# Patient Record
Sex: Female | Born: 1985 | Race: Black or African American | Hispanic: No | Marital: Married | Smoking: Never smoker
Health system: Southern US, Community
[De-identification: ages and names within clinical notes are randomized; demographics above are authoritative.]

## PROBLEM LIST (undated history)

## (undated) DIAGNOSIS — D649 Anemia, unspecified: Secondary | ICD-10-CM

## (undated) DIAGNOSIS — Z973 Presence of spectacles and contact lenses: Secondary | ICD-10-CM

## (undated) DIAGNOSIS — D241 Benign neoplasm of right breast: Secondary | ICD-10-CM

## (undated) DIAGNOSIS — D259 Leiomyoma of uterus, unspecified: Secondary | ICD-10-CM

## (undated) DIAGNOSIS — R7303 Prediabetes: Secondary | ICD-10-CM

## (undated) DIAGNOSIS — D573 Sickle-cell trait: Secondary | ICD-10-CM

## (undated) DIAGNOSIS — K219 Gastro-esophageal reflux disease without esophagitis: Secondary | ICD-10-CM

## (undated) DIAGNOSIS — D219 Benign neoplasm of connective and other soft tissue, unspecified: Secondary | ICD-10-CM

## (undated) DIAGNOSIS — D72819 Decreased white blood cell count, unspecified: Secondary | ICD-10-CM

## (undated) DIAGNOSIS — D242 Benign neoplasm of left breast: Secondary | ICD-10-CM

## (undated) HISTORY — DX: Benign neoplasm of right breast: D24.2

## (undated) HISTORY — DX: Benign neoplasm of right breast: D24.1

## (undated) HISTORY — DX: Sickle-cell trait: D57.3

## (undated) HISTORY — DX: Anemia, unspecified: D64.9

## (undated) HISTORY — DX: Benign neoplasm of connective and other soft tissue, unspecified: D21.9

## (undated) HISTORY — DX: Gastro-esophageal reflux disease without esophagitis: K21.9

## (undated) HISTORY — PX: BREAST SURGERY: SHX581

## (undated) HISTORY — DX: Decreased white blood cell count, unspecified: D72.819

---
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
## 2006-06-14 DIAGNOSIS — D241 Benign neoplasm of right breast: Secondary | ICD-10-CM

## 2006-06-14 HISTORY — DX: Benign neoplasm of right breast: D24.1

## 2006-06-14 HISTORY — PX: BREAST LUMPECTOMY: SHX2

## 2009-06-10 ENCOUNTER — Ambulatory Visit: Payer: Self-pay | Admitting: Physician Assistant

## 2009-06-10 DIAGNOSIS — N63 Unspecified lump in unspecified breast: Secondary | ICD-10-CM | POA: Insufficient documentation

## 2009-06-10 DIAGNOSIS — N644 Mastodynia: Secondary | ICD-10-CM | POA: Insufficient documentation

## 2009-06-10 LAB — CONVERTED CEMR LAB
Nitrite: NEGATIVE
Rapid HIV Screen: NEGATIVE
Specific Gravity, Urine: 1.025

## 2009-06-11 LAB — CONVERTED CEMR LAB
AST: 14 units/L (ref 0–37)
Albumin: 4.2 g/dL (ref 3.5–5.2)
Amphetamine Screen, Ur: NEGATIVE
BUN: 8 mg/dL (ref 6–23)
Basophils Absolute: 0 10*3/uL (ref 0.0–0.1)
Chloride: 103 meq/L (ref 96–112)
Creatinine, Ser: 0.76 mg/dL (ref 0.40–1.20)
Glucose, Bld: 86 mg/dL (ref 70–99)
HCT: 34.9 % — ABNORMAL LOW (ref 36.0–46.0)
Hemoglobin: 11.6 g/dL — ABNORMAL LOW (ref 12.0–15.0)
Lymphs Abs: 1.3 10*3/uL (ref 0.7–4.0)
MCHC: 33.2 g/dL (ref 30.0–36.0)
Phencyclidine (PCP): NEGATIVE
Potassium: 4.8 meq/L (ref 3.5–5.3)
Propoxyphene: NEGATIVE
RBC: 4.43 M/uL (ref 3.87–5.11)
RDW: 12.8 % (ref 11.5–15.5)
Sodium: 137 meq/L (ref 135–145)
TSH: 0.949 microintl units/mL (ref 0.350–4.500)
Total Bilirubin: 0.8 mg/dL (ref 0.3–1.2)
WBC: 3.4 10*3/uL — ABNORMAL LOW (ref 4.0–10.5)

## 2009-06-12 ENCOUNTER — Encounter: Admission: RE | Admit: 2009-06-12 | Discharge: 2009-06-12 | Payer: Self-pay | Admitting: Internal Medicine

## 2009-06-20 ENCOUNTER — Telehealth: Payer: Self-pay | Admitting: Physician Assistant

## 2009-06-25 ENCOUNTER — Ambulatory Visit: Payer: Self-pay | Admitting: Physician Assistant

## 2009-07-06 DIAGNOSIS — D573 Sickle-cell trait: Secondary | ICD-10-CM | POA: Insufficient documentation

## 2009-07-06 DIAGNOSIS — D708 Other neutropenia: Secondary | ICD-10-CM | POA: Insufficient documentation

## 2009-07-06 LAB — CONVERTED CEMR LAB
HCT: 34.5 % — ABNORMAL LOW (ref 36.0–46.0)
Hemoglobin: 11.2 g/dL — ABNORMAL LOW (ref 12.0–15.0)
MCV: 80.4 fL (ref 78.0–100.0)
RBC: 4.29 M/uL (ref 3.87–5.11)
WBC: 2.8 10*3/uL — ABNORMAL LOW (ref 4.0–10.5)

## 2009-07-11 ENCOUNTER — Encounter (INDEPENDENT_AMBULATORY_CARE_PROVIDER_SITE_OTHER): Payer: Self-pay | Admitting: *Deleted

## 2009-07-18 ENCOUNTER — Telehealth: Payer: Self-pay | Admitting: Physician Assistant

## 2009-07-29 ENCOUNTER — Ambulatory Visit: Payer: Self-pay | Admitting: Physician Assistant

## 2009-07-29 DIAGNOSIS — R82998 Other abnormal findings in urine: Secondary | ICD-10-CM | POA: Insufficient documentation

## 2009-07-29 LAB — CONVERTED CEMR LAB
Blood in Urine, dipstick: NEGATIVE
Nitrite: NEGATIVE
Pap Smear: NEGATIVE
Protein, U semiquant: NEGATIVE
Specific Gravity, Urine: 1.015
Whiff Test: NEGATIVE

## 2009-07-30 ENCOUNTER — Encounter: Payer: Self-pay | Admitting: Physician Assistant

## 2009-07-30 LAB — CONVERTED CEMR LAB: GC Probe Amp, Genital: NEGATIVE

## 2009-08-01 ENCOUNTER — Encounter: Payer: Self-pay | Admitting: Physician Assistant

## 2009-08-06 ENCOUNTER — Ambulatory Visit: Payer: Self-pay | Admitting: Oncology

## 2009-08-11 LAB — CBC & DIFF AND RETIC
BASO%: 0.3 % (ref 0.0–2.0)
Basophils Absolute: 0 10*3/uL (ref 0.0–0.1)
EOS%: 1 % (ref 0.0–7.0)
HCT: 36.2 % (ref 34.8–46.6)
HGB: 12.3 g/dL (ref 11.6–15.9)
LYMPH%: 50.1 % — ABNORMAL HIGH (ref 14.0–49.7)
MCHC: 34 g/dL (ref 31.5–36.0)
MONO#: 0.5 10*3/uL (ref 0.1–0.9)
NEUT%: 36.1 % — ABNORMAL LOW (ref 38.4–76.8)
RBC: 4.64 10*6/uL (ref 3.70–5.45)
Retic %: 0.71 % (ref 0.50–1.50)
WBC: 3.9 10*3/uL (ref 3.9–10.3)
nRBC: 0 % (ref 0–0)

## 2009-08-11 LAB — MORPHOLOGY: PLT EST: DECREASED

## 2009-08-11 LAB — LACTATE DEHYDROGENASE: LDH: 130 U/L (ref 94–250)

## 2009-08-13 ENCOUNTER — Encounter: Payer: Self-pay | Admitting: Physician Assistant

## 2009-08-13 LAB — ANTI-NUCLEAR AB-TITER (ANA TITER): ANA Titer 1: 1:160 {titer} — ABNORMAL HIGH

## 2009-08-13 LAB — CONVERTED CEMR LAB
Basophils Absolute: 0 10*3/uL
Basophils Relative: 0.3 %
Eosinophils Absolute: 1 10*3/uL
Hemoglobin: 12.3 g/dL
MCHC: 34 g/dL
MCV: 78 fL
Monocytes Absolute: 0.5 10*3/uL
Monocytes Relative: 12.5 %
Neutro Abs: 1.4 10*3/uL
RBC: 4.64 M/uL
RDW: 12.5 %
Retic Ct Pct: 0.71 %
WBC: 3.9 10*3/uL

## 2009-08-13 LAB — ANA: Anti Nuclear Antibody(ANA): POSITIVE — AB

## 2009-08-20 ENCOUNTER — Encounter: Payer: Self-pay | Admitting: Physician Assistant

## 2009-08-21 ENCOUNTER — Ambulatory Visit: Payer: Self-pay | Admitting: Physician Assistant

## 2009-08-21 LAB — CONVERTED CEMR LAB
Cholesterol: 125 mg/dL (ref 0–200)
HDL: 43 mg/dL (ref >39)
Total CHOL/HDL Ratio: 2.9

## 2009-08-22 ENCOUNTER — Telehealth: Payer: Self-pay | Admitting: Physician Assistant

## 2009-08-22 ENCOUNTER — Encounter: Payer: Self-pay | Admitting: Physician Assistant

## 2009-08-25 LAB — EPSTEIN-BARR VIRUS VCA, IGG: EBV VCA IgG: 3.87 {ISR} — ABNORMAL HIGH

## 2009-08-25 LAB — CMV IGM: CMV IgM: <8 AU/mL (ref <30.0)

## 2009-08-25 LAB — HEPATITIS B SURFACE ANTIGEN: Hepatitis B Surface Ag: NEGATIVE

## 2009-08-25 LAB — EPSTEIN-BARR VIRUS NUCLEAR ANTIGEN ANTIBODY, IGG: EBV NA IgG: 1.38 {ISR} — ABNORMAL HIGH

## 2009-08-27 ENCOUNTER — Encounter: Payer: Self-pay | Admitting: Physician Assistant

## 2009-08-27 ENCOUNTER — Telehealth (INDEPENDENT_AMBULATORY_CARE_PROVIDER_SITE_OTHER): Payer: Self-pay | Admitting: *Deleted

## 2009-09-03 ENCOUNTER — Ambulatory Visit: Payer: Self-pay | Admitting: Oncology

## 2009-09-05 ENCOUNTER — Encounter: Payer: Self-pay | Admitting: Physician Assistant

## 2009-10-05 ENCOUNTER — Encounter: Payer: Self-pay | Admitting: Physician Assistant

## 2009-10-05 DIAGNOSIS — R799 Abnormal finding of blood chemistry, unspecified: Secondary | ICD-10-CM | POA: Insufficient documentation

## 2009-11-13 ENCOUNTER — Encounter: Admission: RE | Admit: 2009-11-13 | Discharge: 2009-11-13 | Payer: Self-pay | Admitting: Internal Medicine

## 2009-11-16 DIAGNOSIS — D249 Benign neoplasm of unspecified breast: Secondary | ICD-10-CM | POA: Insufficient documentation

## 2009-11-26 ENCOUNTER — Ambulatory Visit: Payer: Self-pay | Admitting: Oncology

## 2009-11-28 ENCOUNTER — Ambulatory Visit: Payer: Self-pay | Admitting: Physician Assistant

## 2009-11-28 LAB — CBC WITH DIFFERENTIAL/PLATELET
Basophils Absolute: 0 10*3/uL (ref 0.0–0.1)
HCT: 33.6 % — ABNORMAL LOW (ref 34.8–46.6)
MCH: 26.3 pg (ref 25.1–34.0)
MCHC: 33.6 g/dL (ref 31.5–36.0)
MCV: 78.1 fL — ABNORMAL LOW (ref 79.5–101.0)
NEUT%: 43.8 % (ref 38.4–76.8)
Platelets: 140 10*3/uL — ABNORMAL LOW (ref 145–400)
RBC: 4.3 10*6/uL (ref 3.70–5.45)
RDW: 12.4 % (ref 11.2–14.5)
WBC: 3.2 10*3/uL — ABNORMAL LOW (ref 3.9–10.3)
lymph#: 1.4 10*3/uL (ref 0.9–3.3)

## 2009-11-28 LAB — MORPHOLOGY: RBC Comments: NORMAL

## 2009-12-01 LAB — CONVERTED CEMR LAB
Basophils Absolute: 0 10*3/uL (ref 0.0–0.1)
Lymphs Abs: 1.3 10*3/uL (ref 0.7–4.0)
MCHC: 32.9 g/dL (ref 30.0–36.0)
MCV: 78.5 fL (ref 78.0–100.0)
Monocytes Relative: 15 % — ABNORMAL HIGH (ref 3–12)
Neutro Abs: 1 10*3/uL — ABNORMAL LOW (ref 1.7–7.7)
Neutrophils Relative %: 37 % — ABNORMAL LOW (ref 43–77)
Platelets: 142 10*3/uL — ABNORMAL LOW (ref 150–400)
RDW: 12.5 % (ref 11.5–15.5)
WBC: 2.7 10*3/uL — ABNORMAL LOW (ref 4.0–10.5)

## 2009-12-02 LAB — ANTI-NUCLEAR AB-TITER (ANA TITER): ANA Titer 1: 1:2560 {titer} — ABNORMAL HIGH

## 2009-12-02 LAB — ANTI-DNA ANTIBODY, DOUBLE-STRANDED: ds DNA Ab: 1 IU/mL (ref <30)

## 2009-12-02 LAB — ANA: Anti Nuclear Antibody(ANA): POSITIVE — AB

## 2009-12-29 ENCOUNTER — Encounter: Payer: Self-pay | Admitting: Physician Assistant

## 2010-05-29 ENCOUNTER — Encounter
Admission: RE | Admit: 2010-05-29 | Discharge: 2010-05-29 | Payer: Self-pay | Source: Home / Self Care | Attending: Internal Medicine | Admitting: Internal Medicine

## 2010-06-12 ENCOUNTER — Ambulatory Visit: Payer: Self-pay | Admitting: Physician Assistant

## 2010-07-14 NOTE — Letter (Signed)
Summary: HEMATOLOGY NOTES  HEMATOLOGY NOTES   Imported By: Arta Bruce 10/07/2009 12:54:42  _____________________________________________________________________  External Attachment:    Type:   Image     Comment:   External Document

## 2010-07-14 NOTE — Letter (Signed)
Summary: *HSN Results Follow up  [facility]  [address] [place], [place] [address]   Phone: [phone]  Fax: [phone]      07/11/2009   NEVA RAMASWAMY [address] [place], [place]  [address]   Dear  Ms. KASOKOMA Kalina,                            ____S.Drinkard,FNP   ____D. Gore,FNP       ____B. McPherson,MD   ____V. Rankins,MD    ____E. Mulberry,MD    ____N. Daphine Deutscher, FNP  ____D. Reche Dixon, MD    ____K. Philipp Deputy, MD    ____Other     This letter is to inform you that your recent test(s):  _______Pap Smear    _______Lab Test     _______X-ray    _______ is within acceptable limits  _______ requires a medication change  _______ requires a follow-up lab visit  _______ requires a follow-up visit with your provider   Comments:  We have been trying to reach you.  Please give the office a call at your earliest convenience.       _________________________________________________________ If you have any questions, please contact our office                     Sincerely,  Armenia Shannon [facility]

## 2010-07-14 NOTE — Progress Notes (Signed)
  Phone Note Outgoing Call   Summary of Call: Ultrasound with probable benign findings. Suggested to have f/u ultrasound in 6 mos. Order in system Initial call taken by: Brynda Rim,  June 20, 2009 4:30 PM  Follow-up for Phone Call        Left message on answering machine for pt to call back.Marland KitchenMarland KitchenArmenia Shannon  June 24, 2009 12:22 PM   Additional Follow-up for Phone Call Additional follow up Details #1::        PT IS AWARE........ Armenia Shannon  June 25, 2009 11:47 AM

## 2010-07-14 NOTE — Progress Notes (Signed)
  Phone Note Outgoing Call   Summary of Call: Please schedule patient to have a CBC with diff in 3 mos. Fax a copy to Dr. Cyndie Chime at [facility]. Initial call taken by: Brynda Rim,  August 27, 2009 4:05 PM  Follow-up for Phone Call        graciela, schedule appt for pt to come in for labs please, she don't have to fast Follow-up by: [place] Shannon,  August 29, 2009 12:17 PM  Additional Follow-up for Phone Call Additional follow up Details #1::        Appointment scheduled Pt will come on September 03, 2009 at 2:30 pm..Marland KitchenManon Hilding  August 29, 2009 1:44 PM

## 2010-07-14 NOTE — Progress Notes (Signed)
  Phone Note Outgoing Call   Summary of Call: I left message for her brother to call me back. She needs referral to heme. Please make sure she understands. . . let me talk to her brother when he calls back. Initial call taken by: Tereso Newcomer PA-C,  July 18, 2009 2:57 PM  Follow-up for Phone Call        Left message on answering machine for pt to call back...Marland KitchenMarland KitchenArmenia Shannon  July 18, 2009 3:44 PM  BROTHER IS AWARE AND WOULD LIKE TO SPEAK WITH SCOTT Follow-up by: [place] Shannon,  July 21, 2009 1:30 PM  Additional Follow-up for Phone Call Additional follow up Details #1::        Spoke with brother today and explained the reason for the referral to hematology. Additional Follow-up by: Tereso Newcomer PA-C,  July 21, 2009 2:15 PM

## 2010-07-14 NOTE — Letter (Signed)
Summary: HEMATOLOGY NOTES  HEMATOLOGY NOTES   Imported By: Arta Bruce 11/03/2009 16:50:23  _____________________________________________________________________  External Attachment:    Type:   Image     Comment:   External Document

## 2010-07-14 NOTE — Assessment & Plan Note (Signed)
Summary: cpp exam///gk   Vital Signs:  Patient profile:   25 year old female Menstrual status:  regular LMP:     07/21/2009 Height:      68 inches Weight:      151.4 pounds BMI:     23.10 Temp:     98 degrees F oral Pulse rate:   67 / minute Pulse rhythm:   regular Resp:     18 per minute BP sitting:   109 / 67  (left arm) Cuff size:   regular  Vitals Entered ByGeanie Cooley (July 29, 2009 3:16 PM) CC: Pt here for CPP.  Is Patient Diabetic? No Pain Assessment Patient in pain? no       Does patient need assistance? Functional Status Self care Ambulation Normal LMP (date): 07/21/2009     Enter LMP: 07/21/2009   CC:  Pt here for CPP. Marland Kitchen  History of Present Illness: Here for CPP. Periods regular.  Has one every month.  Notes bleeding for 3 days, skips a day and then has bleeding for 3 more days.  Bleeding gets lighter. No vaginal discharge, odor or burning. Had breast u/s last month and is due to have a repeat in 6 mos. No FHx of breast or ovarian cancer.   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  u/s 06/2009 with probably benign fibroadenomas   b.  f/u sugg. in 6 mos.  Family History: Last updated: 06/10/2009 unremarkable  Social History: Last updated: 06/10/2009 Occupation: unemployed Never Smoked Alcohol use-no Drug use-no Single  Review of Systems      See HPI General:  Denies chills and fever. CV:  Denies chest pain or discomfort and fainting. Resp:  Denies cough. GI:  Denies bloody stools, dark tarry stools, and indigestion. GU:  Denies dysuria and hematuria. MS:  Denies joint pain. Psych:  Denies  depression.  Physical Exam  General:  alert, well-developed, and well-nourished.   Head:  normocephalic and atraumatic.   Eyes:  pupils equal, pupils round, pupils reactive to light, and no retinal abnormalitiies.   Ears:  R ear normal and L ear normal.   Nose:  no external deformity.   Mouth:  pharynx pink and moist, no erythema, and no exudates.   Neck:  supple.   Breasts:  previously examined  Lungs:  normal breath sounds, no crackles, and no wheezes.   Heart:  normal rate and regular rhythm.   Abdomen:  soft, non-tender, and no hepatomegaly.   Rectal:  no external abnormalities.   Genitalia:  normal introitus, no external lesions, no vaginal discharge, mucosa pink and moist, no vaginal or cervical lesions, no vaginal atrophy, no friaility or hemorrhage, normal uterus size and position, and no adnexal masses or tenderness.   Msk:  normal ROM.   Extremities:  no edema Neurologic:  alert & oriented X3 and cranial nerves II-XII intact.   Skin:  turgor normal.   Psych:  normally interactive.     Impression & Recommendations:  Problem # 1:  PREVENTIVE HEALTH CARE (ICD-V70.0) pap today schedule lipids Td up to date  Orders: KOH/ WET Mount [phone]) T- GC Chlamydia ([address]) T-Pap Smear, Thin Prep (57846)  Problem #  2:  BREAST MASS, RIGHT (ICD-611.72) f/u u/s scheduled in June already  Problem # 3:  OTHER NEUTROPENIA (ICD-288.09) Heme appt pending  Other Orders: T-Culture, Urine ([id number])  Patient Instructions: 1)  Take Calcium 600mg  + Vitamin D 400 international units twice daily. 2)  Schedule lab visit at your convenience in the next 2-4 weeks for Lipids (come fasting; nothing to eat or drink, except water, after midnight the night before).  Dx V70.0 3)  Please schedule a follow-up appointment in 1 year with Marin Milley for CPP or sooner if needed.   Laboratory Results   Urine Tests  Date/Time Received: July 29, 2009 3:49 PM   Routine Urinalysis   Color: lt.  yellow Glucose: negative   (Normal Range: Negative) Bilirubin: negative   (Normal Range: Negative) Ketone: negative   (Normal Range: Negative) Spec. Gravity: 1.015   (Normal Range: 1.003-1.035) Blood: negative   (Normal Range: Negative) pH: 6.0   (Normal Range: 5.0-8.0) Protein: negative   (Normal Range: Negative) Urobilinogen: 0.2   (Normal Range: 0-1) Nitrite: negative   (Normal Range: Negative) Leukocyte Esterace: trace   (Normal Range: Negative)      Wet Mount Source: vaginal WBC/hpf: 1-5 Bacteria/hpf: rare Clue cells/hpf: none  Negative whiff Yeast/hpf: none Wet Mount KOH: Negative Trichomonas/hpf: none

## 2010-07-14 NOTE — Letter (Signed)
[facility]   Imported By: Arta Bruce 08/28/2009 10:10:35  _____________________________________________________________________  External Attachment:    Type:   Image     Comment:   External Document

## 2010-07-14 NOTE — Progress Notes (Signed)
Summary: Office Visit/DEPRESSION SCREENING  Office Visit/DEPRESSION SCREENING   Imported By: Arta Bruce 09/26/2009 14:28:49  _____________________________________________________________________  External Attachment:    Type:   Image     Comment:   External Document

## 2010-07-14 NOTE — Miscellaneous (Signed)
Summary: Hematology Evaluation   Patient seen by Dr. Cyndie Chime. I spoke to him personally. He suspected she has a smoldering autoimmune process likely contributing to her neutropenia. Her ANA was positive.  ESR was 27. ANA Titer:  1:160 Pattern:  Speckled  Dr. Cyndie Chime suggested checking a CBC q 3 mos.  He felt like her neutropenia/thrombocytopenia should just be followed.  He plans to see her in 6 mos.  No further w/u needed currently for her + ANA as she is asymptomatic.  Clinical Lists Changes  Observations: Added new observation of LDH SERUM: 130 units/L (08/13/2009 15:54) Added new observation of RETICULOCTAB: 32.94  (08/13/2009 15:54) Added new observation of RETIC COUNT: 0.71 % % (08/13/2009 15:54) Added new observation of ABSOLUTE BAS: 0.0 K/uL (08/13/2009 15:54) Added new observation of BASOPHIL %: 0.3 % (08/13/2009 15:54) Added new observation of EOS ABSLT: 1.0 K/uL (08/13/2009 15:54) Added new observation of % EOS AUTO: 1.0 % (08/13/2009 15:54) Added new observation of ABSOLUTE MON: 0.5 K/uL (08/13/2009 15:54) Added new observation of MONOCYTE %: 12.5 % (08/13/2009 15:54) Added new observation of ABS LYMPHOCY: 1.9 K/uL (08/13/2009 15:54) Added new observation of LYMPHS %: 50.1 % (08/13/2009 15:54) Added new observation of ABS NEUTROPH: 1.4 K/uL (08/13/2009 15:54) Added new observation of PMN %: 36.1 % (08/13/2009 15:54) Added new observation of PLATELETK/UL: 130 K/uL (08/13/2009 15:54) Added new observation of RDW: 12.5 % (08/13/2009 15:54) Added new observation of MCHC RBC: 34 g/dL (30/16/0109 32:35) Added new observation of MCV: 78 fL (08/13/2009 15:54) Added new observation of HCT: 36.2 % (08/13/2009 15:54) Added new observation of HGB: 12.3 g/dL (57/32/2025 42:70) Added new observation of RBC M/UL: 4.64 M/uL (08/13/2009 15:54) Added new observation of WBC COUNT: 3.9 10*3/microliter (08/13/2009 15:54) Added new observation of ESR: 27 mm/hr (08/13/2009  15:54) Added new observation of ANA TITER: 1:160 (speckled)  (08/13/2009 15:54) Added new observation of ANA: Positive  (08/13/2009 15:54)

## 2010-07-14 NOTE — Letter (Signed)
Summary: HEMATOLOGY NOTES  HEMATOLOGY NOTES   Imported By: Arta Bruce 09/11/2009 09:46:43  _____________________________________________________________________  External Attachment:    Type:   Image     Comment:   External Document

## 2010-07-14 NOTE — Miscellaneous (Signed)
Summary: f/u with Heme 11/2009  Clinical Lists Changes  Observations: Added new observation of PAST MED HX: Unremarkable Breast Fibroadenomas   a.  u/s 06/2009 with probably benign fibroadenomas   b.  f/u sugg. in 6 mos. Leukopenia/thrombocytopenia   a. eval by Dr. Cyndie Chime of Heme   b. ANA + (speckled): plan for observation by Heme (patient without symptoms)   c. 11/2009: f/u with Heme; repeat ANA + (1:2560); anti dsDNA neg; suspect immune etiology of cytopenias; no symptoms; observation continued; f/u with Heme in 05/2010  (12/29/2009 12:57)       Past History:  Past Medical History: Unremarkable Breast Fibroadenomas   a.  u/s 06/2009 with probably benign fibroadenomas   b.  f/u sugg. in 6 mos. Leukopenia/thrombocytopenia   a. eval by Dr. Cyndie Chime of Heme   b. ANA + (speckled): plan for observation by Heme (patient without symptoms)   c. 11/2009: f/u with Heme; repeat ANA + (1:2560); anti dsDNA neg; suspect immune etiology of cytopenias; no symptoms; observation continued; f/u with Heme in 05/2010

## 2010-07-14 NOTE — Miscellaneous (Signed)
Summary: Eval by Hematology  Clinical Lists Changes  Problems: Added new problem of ANA POSITIVE (ICD-790.99) Assessed OTHER NEUTROPENIA as comment only -  evaluated by Dr. Cyndie Chime negative work up except for + ANA with speckled pattern Hep A, B, C negative Neg IgM for CMV elevated IgG for EBV - ? significance  plan is for observation alone f/u 3 mos with Dr. Cyndie Chime (June 2011) Assessed ANA POSITIVE as comment only -  speckled pattern no signs or symptoms of collagen vascular disease plan observation only eval by Hematology Observations: Added new observation of PAST MED HX: Unremarkable Breast Fibroadenomas   a.  u/s 06/2009 with probably benign fibroadenomas   b.  f/u sugg. in 6 mos. Leukopenia/thrombocytopenia   a. eval by Dr. Cyndie Chime of Heme   b. ANA + (speckled): plan for observation by Heme (patient without symptoms) (10/05/2009 22:34)       Impression & Recommendations:  Problem # 1:  OTHER NEUTROPENIA (ICD-288.09)  evaluated by Dr. Cyndie Chime negative work up except for + ANA with speckled pattern Hep A, B, C negative Neg IgM for CMV elevated IgG for EBV - ? significance  plan is for observation alone f/u 3 mos with Dr. Cyndie Chime (June 2011)  Problem # 2:  ANA POSITIVE (ICD-790.99)  speckled pattern no signs or symptoms of collagen vascular disease plan observation only eval by Hematology   Past History:  Past Medical History: Unremarkable Breast Fibroadenomas   a.  u/s 06/2009 with probably benign fibroadenomas   b.  f/u sugg. in 6 mos. Leukopenia/thrombocytopenia   a. eval by Dr. Cyndie Chime of Heme   b. ANA + (speckled): plan for observation by Heme (patient without symptoms)

## 2010-07-14 NOTE — Letter (Signed)
Summary: *HSN Results Follow up  [facility]  [address] [place], [place] [address]   Phone: [phone]  Fax: [phone]      08/01/2009   QUEENA MONRREAL [address] [place], [place]  [address]   Dear  Ms. KASOKOMA Thrall,                            ____S.Drinkard,FNP   ____D. Gore,FNP       ____B. McPherson,MD   ____V. Rankins,MD    ____E. Mulberry,MD    ____N. Daphine Deutscher, FNP  ____D. Reche Dixon, MD    ____K. Philipp Deputy, MD    __x__S. Alben Spittle, PA-C    This letter is to inform you that your recent test(s):  ___x____Pap Smear    _______Lab Test     _______X-ray    ___x____ is within acceptable limits  _______ requires a medication change  _______ requires a follow-up lab visit  _______ requires a follow-up visit with your provider   Comments:       _________________________________________________________ If you have any questions, please contact our office                     Sincerely,  Tereso Newcomer PA-C [facility]

## 2010-07-14 NOTE — Letter (Signed)
Summary: *HSN Results Follow up  [facility]  [address] [place], [place] [address]   Phone: [phone]  Fax: [phone]      08/22/2009   Christine Holland [address] [place], [place]  [address]   Dear  [name],                            ____S.Drinkard,FNP   ____D. Gore,FNP       ____B. McPherson,MD   ____V. Rankins,MD    ____E. Mulberry,MD    ____N. Daphine Deutscher, FNP  ____D. Reche Dixon, MD    ____K. Philipp Deputy, MD    __x__S. Alben Spittle, PA-C     This letter is to inform you that your recent test(s):  _______Pap Smear    ___x____Lab Test     _______X-ray    ___x____ is within acceptable limits  _______ requires a medication change  _______ requires a follow-up lab visit  _______ requires a follow-up visit with your provider   Comments:  Your cholesterol looked great.       _________________________________________________________ If you have any questions, please contact our office                     Sincerely,  Tereso Newcomer PA-C [facility]

## 2010-07-14 NOTE — Letter (Signed)
[facility]: [facility]OFFICE PROGRESS NOTE  [facility]ESS [facility]   Imported By: Arta Bruce 12/30/2009 14:59:34  _____________________________________________________________________  External Attachment:    Type:   Image     Comment:   External Document

## 2010-07-14 NOTE — Progress Notes (Signed)
  Phone Note Outgoing Call   Summary of Call: Please f/u on hematology referral. Initial call taken by: Brynda Rim,  August 22, 2009 3:01 PM  Follow-up for Phone Call        hey debra could you let me know about this pt's appt to the hema. appt Follow-up by: Arm[place] Shannon,  August 22, 2009 4:43 PM  Additional Follow-up for Phone Call Additional follow up Details #1::        Actually got labs from Dr. Cyndie Chime. Please contact their office to get consult note . . . don't think I've gotten it yet.  Additional Follow-up by: Tereso Newcomer PA-C,  August 22, 2009 4:48 PM    Additional Follow-up for Phone Call Additional follow up Details #2::    notes are being faxed to [place] Follow-up by: [place] Shannon,  August 25, 2009 12:53 PM

## 2010-09-28 ENCOUNTER — Inpatient Hospital Stay (INDEPENDENT_AMBULATORY_CARE_PROVIDER_SITE_OTHER)
Admission: RE | Admit: 2010-09-28 | Discharge: 2010-09-28 | Disposition: A | Discharge status: Home / Self Care | Payer: Self-pay | Source: Ambulatory Visit | Attending: Family Medicine | Admitting: Family Medicine

## 2010-09-28 DIAGNOSIS — H00019 Hordeolum externum unspecified eye, unspecified eyelid: Secondary | ICD-10-CM

## 2011-01-15 ENCOUNTER — Other Ambulatory Visit: Payer: Self-pay | Admitting: Oncology

## 2011-01-15 ENCOUNTER — Encounter (HOSPITAL_BASED_OUTPATIENT_CLINIC_OR_DEPARTMENT_OTHER): Payer: Self-pay | Admitting: Oncology

## 2011-01-15 DIAGNOSIS — D573 Sickle-cell trait: Secondary | ICD-10-CM

## 2011-01-15 DIAGNOSIS — D61818 Other pancytopenia: Secondary | ICD-10-CM

## 2011-01-15 LAB — CBC WITH DIFFERENTIAL/PLATELET
Eosinophils Absolute: 0 10*3/uL (ref 0.0–0.5)
HCT: 34.5 % — ABNORMAL LOW (ref 34.8–46.6)
HGB: 11.7 g/dL (ref 11.6–15.9)
LYMPH%: 56.6 % — ABNORMAL HIGH (ref 14.0–49.7)
MCHC: 33.9 g/dL (ref 31.5–36.0)
MONO#: 0.4 10*3/uL (ref 0.1–0.9)
NEUT%: 31.1 % — ABNORMAL LOW (ref 38.4–76.8)
RBC: 4.44 10*6/uL (ref 3.70–5.45)
RDW: 12.5 % (ref 11.2–14.5)
lymph#: 1.8 10*3/uL (ref 0.9–3.3)

## 2011-01-15 LAB — MORPHOLOGY: PLT EST: ADEQUATE

## 2011-01-18 LAB — RETICULOCYTES (CHCC): ABS Retic: 39.7 10*3/uL (ref 19.0–186.0)

## 2011-01-18 LAB — SEDIMENTATION RATE: Sed Rate: 25 mm/hr — ABNORMAL HIGH (ref 0–22)

## 2011-06-15 DIAGNOSIS — D560 Alpha thalassemia: Secondary | ICD-10-CM

## 2011-06-15 DIAGNOSIS — D61818 Other pancytopenia: Secondary | ICD-10-CM

## 2011-06-15 HISTORY — DX: Alpha thalassemia: D56.0

## 2011-06-15 HISTORY — DX: Other pancytopenia: D61.818

## 2011-06-26 ENCOUNTER — Telehealth: Payer: Self-pay | Admitting: Oncology

## 2011-06-26 NOTE — Telephone Encounter (Signed)
mailed appt sch for 09/2011    aom

## 2011-07-02 ENCOUNTER — Other Ambulatory Visit: Payer: Self-pay | Admitting: Internal Medicine

## 2011-07-02 ENCOUNTER — Other Ambulatory Visit: Payer: Self-pay | Admitting: Family Medicine

## 2011-07-02 DIAGNOSIS — N63 Unspecified lump in unspecified breast: Secondary | ICD-10-CM

## 2011-07-05 ENCOUNTER — Ambulatory Visit
Admission: RE | Admit: 2011-07-05 | Discharge: 2011-07-05 | Disposition: A | Discharge status: Home / Self Care | Payer: Self-pay | Source: Ambulatory Visit | Attending: Family Medicine | Admitting: Family Medicine

## 2011-07-05 DIAGNOSIS — N63 Unspecified lump in unspecified breast: Secondary | ICD-10-CM

## 2011-09-13 ENCOUNTER — Ambulatory Visit: Payer: Self-pay | Admitting: Oncology

## 2011-09-13 ENCOUNTER — Encounter: Payer: Self-pay | Admitting: Oncology

## 2011-09-13 NOTE — Progress Notes (Signed)
Ms. Fennelly failed to report for today's visit. She is a 26 year old native African woman followed in this office since March of 2011 referred for further evaluation of mild pancytopenia. 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 White blood counts run around 3900 platelet count 130,000. She has a normal physical exam. No constitutional symptoms. No signs or symptoms of a collagen vascular disease but ANA was elevated at 1:160 with a speckled pattern. A repeat value was much higher at 1:2560. Anti-DNA double-stranded antibodies were negative. . ESR 27 mm. HIV negative. Hepatitis A, B., and C. negative. Negative IgM against CMV virus. Elevated IgG against EBV virus. Prior history of malarial when living in [place]. Review of the peripheral blood film was unremarkable. I felt that her pancytopenia was most likely due to a developing collagen vascular disorder and was on a immune basis. Since she was asymptomatic I did not feel any additional treatment other than observation was indicated.

## 2011-11-18 ENCOUNTER — Telehealth: Payer: Self-pay | Admitting: Oncology

## 2011-11-18 NOTE — Telephone Encounter (Signed)
Talked to pt and gave her appt for August 2013

## 2012-02-04 ENCOUNTER — Other Ambulatory Visit: Payer: Self-pay | Admitting: *Deleted

## 2012-02-04 ENCOUNTER — Other Ambulatory Visit (HOSPITAL_BASED_OUTPATIENT_CLINIC_OR_DEPARTMENT_OTHER): Payer: Self-pay

## 2012-02-04 ENCOUNTER — Telehealth: Payer: Self-pay | Admitting: Oncology

## 2012-02-04 DIAGNOSIS — D573 Sickle-cell trait: Secondary | ICD-10-CM

## 2012-02-04 DIAGNOSIS — D249 Benign neoplasm of unspecified breast: Secondary | ICD-10-CM

## 2012-02-04 DIAGNOSIS — R799 Abnormal finding of blood chemistry, unspecified: Secondary | ICD-10-CM

## 2012-02-04 DIAGNOSIS — D708 Other neutropenia: Secondary | ICD-10-CM

## 2012-02-04 DIAGNOSIS — D61818 Other pancytopenia: Secondary | ICD-10-CM

## 2012-02-04 LAB — CBC WITH DIFFERENTIAL/PLATELET
BASO%: 0.2 % (ref 0.0–2.0)
Basophils Absolute: 0 10*3/uL (ref 0.0–0.1)
EOS%: 0.6 % (ref 0.0–7.0)
HGB: 10.8 g/dL — ABNORMAL LOW (ref 11.6–15.9)
MCH: 26.5 pg (ref 25.1–34.0)
MCHC: 33.4 g/dL (ref 31.5–36.0)
MONO#: 0.5 10*3/uL (ref 0.1–0.9)
RDW: 13.4 % (ref 11.2–14.5)
WBC: 3.8 10*3/uL — ABNORMAL LOW (ref 3.9–10.3)
lymph#: 1.4 10*3/uL (ref 0.9–3.3)

## 2012-02-04 LAB — MORPHOLOGY

## 2012-02-04 NOTE — Telephone Encounter (Signed)
Pt came in and r/s appt for 8/26 to November 2014 1st opening for MD, Nurse notified

## 2012-02-07 ENCOUNTER — Ambulatory Visit: Payer: Self-pay | Admitting: Oncology

## 2012-02-08 LAB — ANCA SCREEN W REFLEX TITER
c-ANCA Screen: NEGATIVE
p-ANCA Screen: NEGATIVE

## 2012-02-08 LAB — ANTI-NUCLEAR AB-TITER (ANA TITER): ANA Titer 1: 1:320 {titer} — ABNORMAL HIGH

## 2012-04-17 ENCOUNTER — Ambulatory Visit (HOSPITAL_BASED_OUTPATIENT_CLINIC_OR_DEPARTMENT_OTHER): Payer: BC Managed Care – PPO | Admitting: Lab

## 2012-04-17 ENCOUNTER — Telehealth: Payer: Self-pay | Admitting: Oncology

## 2012-04-17 ENCOUNTER — Ambulatory Visit (HOSPITAL_BASED_OUTPATIENT_CLINIC_OR_DEPARTMENT_OTHER): Payer: BC Managed Care – PPO | Admitting: Oncology

## 2012-04-17 ENCOUNTER — Other Ambulatory Visit: Payer: Self-pay | Admitting: *Deleted

## 2012-04-17 VITALS — BP 134/68 | HR 85 | Temp 98.1°F | Resp 20 | Ht 68.0 in | Wt 175.9 lb

## 2012-04-17 DIAGNOSIS — N63 Unspecified lump in unspecified breast: Secondary | ICD-10-CM

## 2012-04-17 DIAGNOSIS — D708 Other neutropenia: Secondary | ICD-10-CM

## 2012-04-17 DIAGNOSIS — R799 Abnormal finding of blood chemistry, unspecified: Secondary | ICD-10-CM

## 2012-04-17 DIAGNOSIS — D61818 Other pancytopenia: Secondary | ICD-10-CM

## 2012-04-17 LAB — CBC WITH DIFFERENTIAL/PLATELET
EOS%: 0.9 % (ref 0.0–7.0)
Eosinophils Absolute: 0 10*3/uL (ref 0.0–0.5)
LYMPH%: 42.3 % (ref 14.0–49.7)
MCH: 26.4 pg (ref 25.1–34.0)
MCHC: 32.9 g/dL (ref 31.5–36.0)
MCV: 80 fL (ref 79.5–101.0)
MONO%: 12.8 % (ref 0.0–14.0)
NEUT#: 1.4 10*3/uL — ABNORMAL LOW (ref 1.5–6.5)
Platelets: 147 10*3/uL (ref 145–400)
RBC: 4.19 10*6/uL (ref 3.70–5.45)

## 2012-04-17 LAB — MORPHOLOGY: PLT EST: ADEQUATE

## 2012-04-17 LAB — SEDIMENTATION RATE: Sed Rate: 30 mm/hr — ABNORMAL HIGH (ref 0–22)

## 2012-04-17 NOTE — Progress Notes (Signed)
Hematology and Oncology Follow Up Visit  Christine Holland [id number] [birth year] 26 y.o. 04/17/2012 5:48 PM   Principle Diagnosis: Encounter Diagnoses  Name Primary?  . ANA POSITIVE   . Other neutropenia Yes     Interim History:   Followup visit for this 26 year old woman from the [place] in [place] referred here 2 years ago for further evaluation of mild leukopenia and mild thrombocytopenia.  Lab in our office on August 11, 2009 - hemoglobin 12, hematocrit 36, MCV 78, white count 3900, 36 neutrophils, 50 lymphocytes, 12 monocytes, 1 eosinophil, retic count 0.7%, platelet count 130,000, ESR 27 mm.  Review of the peripheral blood film was unremarkable.  She had a normal physical examination.  She did give a history of malaria while living in [place] but has had no recent flare ups and I did not see any parasites on my exam of the peripheral blood.  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    She did have a positive ANA, 1:160 speckled pattern, but no signs or symptoms of a collagen vascular disorder.  No family history of anemia or collagen vascular disorder.  I initially felt the most likely etiology of her mild leukopenia and thrombocytopenia related to a previous viral infection with some chronic low-grade suppression of her blood production.     She already tested negative for HIV through her primary care.  Testing done here showed that she is hepatitis A, B and C negative.  Negative IgM against CMV virus, she does have elevated IgG titers against EBV, which although could be associated with bone marrow suppression, is so common that the best one can do is imply an effect but not prove it.  Subsequent lab studies done through this office have shown overall stable hematologic profile. However, ANAs have been as high as 1:2560 recorded on 11/28/2009. Most recent  value done on August 23 was 1:320. Speckled pattern. Negative anti-double-stranded DNA and negative c-ANCA and pANCA screen.  She remains asymptomatic. She has graduated college and is now working for the [employer]. She specifically denies any polyarthralgia polymyalgia.  Medications: reviewed  Allergies: Not on File  Review of Systems: Constitutional:   No constitutional symptoms Respiratory: No cough or dyspnea Cardiovascular:  No palpitations Gastrointestinal: no change in bowel habit Genito-Urinary: Not questioned Musculoskeletal: See above Neurologic: No headache or change in vision Skin: No rash or ecchymosis Remaining ROS negative.  Physical Exam: Blood pressure 134/68, pulse 85, temperature 98.1 F (36.7 C), temperature source Oral, resp. rate 20, height 5\' 8"  (1.727 m), weight 175 lb 14.4 oz (79.788 kg). Wt Readings from Last 3 Encounters:  04/17/12 175 lb 14.4 oz (79.788 kg)  07/29/09 151 lb 6.4 oz (68.675 kg)  06/10/09 152 lb (68.947 kg)     General appearance: Healthy-appearing African woman HENNT: Pharynx no erythema or exudate Lymph nodes: No lymphadenopathy Breasts: Lungs: Clear to auscultation resonant to percussion Heart: Regular rhythm no murmur or gallop Abdomen: Soft nontender, no mass, no organomegaly Extremities: No edema, no calf tenderness Vascular: No cyanosis Neurologic: No focal deficit Skin: No rash or ecchymosis  Lab Results: Lab Results white count differential: 44% neutrophils, 42% lymphocytes, 13% monocytes, 1 eosinophil   Component Value Date   WBC 3.3* 04/17/2012   HGB 11.0* 04/17/2012   HCT 33.5* 04/17/2012   MCV 80.0 04/17/2012   PLT 147 04/17/2012  Chemistry      Component Value Date/Time   NA 137 06/10/2009 2109   K 4.8 06/10/2009 2109   CL 103 06/10/2009 2109   CO2 24 06/10/2009 2109   BUN 8 06/10/2009 2109   CREATININE 0.76 06/10/2009 2109      Component Value Date/Time   CALCIUM 9.0 06/10/2009 2109   ALKPHOS  48 06/10/2009 2109   AST 14 06/10/2009 2109   ALT 8 06/10/2009 2109   BILITOT 0.8 06/10/2009 2109       Impression and Plan: Chronic, stable, mild, pancytopenia. Mild microcytic anemia likely combination of iron deficiency and thalassemia trait. Neutropenia and thrombocytopenia likely on an immune basis given high ANA titers. She currently has no signs or symptoms of a collagen vascular disorder now on followup for over 2 years. I was here again in one year.   CC:. Dr. Julieanne Manson   Levert Feinstein, MD 11/4/20135:48 PM

## 2012-04-17 NOTE — Telephone Encounter (Signed)
appts made and printed for pt pt sent back to the lab     aom

## 2013-04-16 ENCOUNTER — Other Ambulatory Visit: Payer: BC Managed Care – PPO | Admitting: Lab

## 2013-04-16 ENCOUNTER — Ambulatory Visit: Payer: BC Managed Care – PPO | Admitting: Oncology

## 2013-08-11 ENCOUNTER — Encounter: Payer: Self-pay | Admitting: Oncology

## 2014-03-27 ENCOUNTER — Ambulatory Visit (INDEPENDENT_AMBULATORY_CARE_PROVIDER_SITE_OTHER): Payer: BC Managed Care – PPO | Admitting: Family Medicine

## 2014-03-27 ENCOUNTER — Ambulatory Visit (INDEPENDENT_AMBULATORY_CARE_PROVIDER_SITE_OTHER): Payer: BC Managed Care – PPO

## 2014-03-27 VITALS — BP 124/68 | HR 88 | Temp 98.9°F | Resp 16 | Ht 68.25 in | Wt 177.6 lb

## 2014-03-27 DIAGNOSIS — R21 Rash and other nonspecific skin eruption: Secondary | ICD-10-CM

## 2014-03-27 DIAGNOSIS — J029 Acute pharyngitis, unspecified: Secondary | ICD-10-CM

## 2014-03-27 DIAGNOSIS — R079 Chest pain, unspecified: Secondary | ICD-10-CM

## 2014-03-27 DIAGNOSIS — L282 Other prurigo: Secondary | ICD-10-CM

## 2014-03-27 DIAGNOSIS — R51 Headache: Secondary | ICD-10-CM

## 2014-03-27 DIAGNOSIS — R238 Other skin changes: Secondary | ICD-10-CM

## 2014-03-27 DIAGNOSIS — M791 Myalgia, unspecified site: Secondary | ICD-10-CM

## 2014-03-27 DIAGNOSIS — R197 Diarrhea, unspecified: Secondary | ICD-10-CM

## 2014-03-27 DIAGNOSIS — R519 Headache, unspecified: Secondary | ICD-10-CM

## 2014-03-27 LAB — POCT CBC
GRANULOCYTE PERCENT: 56.9 % (ref 37–80)
HCT, POC: 37.6 % — AB (ref 37.7–47.9)
HEMOGLOBIN: 12 g/dL — AB (ref 12.2–16.2)
Lymph, poc: 1.1 (ref 0.6–3.4)
MCH: 25.4 pg — AB (ref 27–31.2)
MCHC: 31.9 g/dL (ref 31.8–35.4)
MCV: 79.7 fL — AB (ref 80–97)
MID (CBC): 0.4 (ref 0–0.9)
MPV: 9.3 fL (ref 0–99.8)
PLATELET COUNT, POC: 111 10*3/uL — AB (ref 142–424)
POC Granulocyte: 2 (ref 2–6.9)
POC LYMPH PERCENT: 32.7 %L (ref 10–50)
POC MID %: 10.4 % (ref 0–12)
RBC: 4.72 M/uL (ref 4.04–5.48)
RDW, POC: 13.3 %
WBC: 3.5 10*3/uL — AB (ref 4.6–10.2)

## 2014-03-27 LAB — POCT INFLUENZA A/B
Influenza A, POC: NEGATIVE
Influenza B, POC: NEGATIVE

## 2014-03-27 MED ORDER — VALACYCLOVIR HCL 1 G PO TABS: 1000.0000 mg | ORAL_TABLET | Freq: Three times a day (TID) | ORAL | Status: AC

## 2014-03-27 NOTE — Progress Notes (Addendum)
Subjective:    Patient ID: Christine Holland, female    DOB: 05/16/1986, 28 y.o.   MRN: [id number]  Headache   Rash  Abdominal Pain   Headache  Associated symptoms include abdominal pain, neck pain, photophobia and a sore throat. Pertinent negatives include no back pain, ear pain, fever, rhinorrhea or sinus pressure.  Rash  Associated symptoms include diarrhea, fatigue and a sore throat. Pertinent negatives include no fever or rhinorrhea.  Abdominal Pain  Associated symptoms include diarrhea, headaches and myalgias. Pertinent negatives include no dysuria, fever or hematuria.    This is a 28 year old female with a history of neutropenia, sickle-cell trait, and a past +ANA, who is here today for a severe headache, joint pain, chills, and neck pain.  The first day, she had a severe throbbing headache localized bilaterally at the temples.  Its associated with photophobia and pain with eyemovement.  This followed by the onset of joint pain, generalized malaise, diarrhea, and myalgia.  She took advil on the first day, which helped her sleep.  On day 2 she had a sore throat and neck pain that that made it painful to swallow and rotate her head.  This was associated with chills.   She denies trouble breathing, talking, or trauma.  The 3rd day, she noticed a fluid-filled bump on her right breast, which progressed to more along her back and arms.  They are pruritic and will burst, leaving a dark mark.  She took efferalgan this day which helped relieve pain.  Day 4 (today), as she was driving to [facility], she felt severe pain in her chest that moves vertically substernal and with a pressure.  She denies eating anything different around the time of these symptoms.  It resolved spontaneously, but returned while getting her vitals.  She has had one nose bleed during this time, though unsure of which day.  Today, the neck pain has resolved.  She denies blood on stool, dysuria, sneezing, coughing, dizziness,  lightheadedness or easy bruising.  She has not been around any sick contacts.  She has not travelled within the last 2 months, nor knows of anyone with direct contact that has travelled within the last 2 months.  She only takes an iron supplement.  [place]  She had malaria there about 10 years ago.  She has never engaged in sexual intercourse.       Review of Systems  Constitutional: Positive for chills and fatigue. Negative for fever.  HENT: Positive for sore throat. Negative for ear pain, rhinorrhea, sinus pressure and sneezing.  Eyes: Positive for photophobia.  Gastrointestinal: Positive for abdominal pain and diarrhea. Negative for blood in stool.  Endocrine: Negative for polyuria.  Genitourinary: Negative for dysuria and hematuria.  Musculoskeletal: Positive for myalgias, neck pain and neck stiffness. Negative for back pain and joint swelling.  Skin: Positive for rash.  Neurological: Positive for headaches.     Objective:   Physical Exam  Constitutional: She is oriented to person, place, and time. She appears well-developed and well-nourished.  HENT:  Head: Normocephalic and atraumatic.  Eyes: Conjunctivae are normal. Right pupil is not round and not reactive. Left pupil is not round and not reactive.  Neck: No spinous process tenderness and no muscular tenderness present. No Kernig's sign noted. No thyromegaly present.    Cardiovascular: Normal rate, regular rhythm and normal heart sounds.  Exam reveals no friction rub.   No murmur heard. Pulmonary/Chest: Effort  normal and breath sounds normal.  Lymphadenopathy:       Head (right side): Posterior auricular (Tender with palpation) adenopathy present.       Head (left side): No posterior auricular adenopathy present.    She has no cervical adenopathy.  Neurological: She is alert and oriented to person, place, and time. She exhibits normal muscle tone. Coordination normal.  Negative Kernig sign.      Skin: Skin is warm and dry.       BP 124/68  Pulse 88  Temp(Src) 98.9 F (37.2 C) (Oral)  Resp 16  Ht 5' 8.25" (1.734 m)  Wt 177 lb 9.6 oz (80.559 kg)  BMI 26.79 kg/m2  SpO2 99%  LMP 03/07/2014   Results for orders placed in visit on 03/27/14  POCT CBC      Result Value Ref Range   WBC 3.5 (*) 4.6 - 10.2 K/uL   Lymph, poc 1.1  0.6 - 3.4   POC LYMPH PERCENT 32.7  10 - 50 %L   MID (cbc) 0.4  0 - 0.9   POC MID % 10.4  0 - 12 %M   POC Granulocyte 2.0  2 - 6.9   Granulocyte percent 56.9  37 - 80 %G   RBC 4.72  4.04 - 5.48 M/uL   Hemoglobin 12.0 (*) 12.2 - 16.2 g/dL   HCT, POC 37.6 (*) 37.7 - 47.9 %   MCV 79.7 (*) 80 - 97 fL   MCH, POC 25.4 (*) 27 - 31.2 pg   MCHC 31.9  31.8 - 35.4 g/dL   RDW, POC 13.3     Platelet Count, POC 111 (*) 142 - 424 K/uL   MPV 9.3  0 - 99.8 fL  POCT INFLUENZA A/B      Result Value Ref Range   Influenza A, POC Negative     Influenza B, POC Negative     Chest Xray [facility] reading (PRIMARY) by  Dr. Carlota Raspberry. Few increase markings without discrete infiltrates.    EKG by Dr. Carlota Raspberry: Early Repole.  No acute findings     Assessment & Plan:  28 year old female is here today for a chief complaint of headache, myalgia, and rash. She is presenting without acute symptomatic distress (w/o SIRS).  The vesicles are very suspicious of varicella virus, chicken pox.  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  Influenza test were negative, which can be excluded from differential.  We will await ESR, CMP, and viral cultures for further evaluation.   Acyclovir offers treatment for varicella with very little adverse reactions.  I will treat her with the expectation that this is varicella, and follow up with any changes as labs return.  Will also follow up within 24 hours, as this patient is concerning for encephalitis, with her headache, though she is not expressing any acute meningeal or  encephalitic symptoms.  Chest pain, unspecified chest pain type - EKG 12-Lead, Sedimentation rate  Diarrhea - Plan: POCT CBC, COMPLETE METABOLIC PANEL WITH GFR, Sedimentation rate, CANCELED: POCT SEDIMENTATION RATE  Sore throat - Plan: POCT CBC, POCT Influenza A/B, Sedimentation rate, CANCELED: POCT SEDIMENTATION RATE  Nonintractable headache, unspecified chronicity pattern, unspecified headache type - Plan: Sedimentation rate, CANCELED: POCT SEDIMENTATION RATE  Myalgia - Plan: POCT Influenza A/B, Sedimentation rate  Vesicular rash - PViral culture  Ivar Drape, PA-C [facility] 10/14/20159:10 PM

## 2014-03-27 NOTE — Patient Instructions (Signed)
Get rest and stay hydrated. Take acetaminophen for fever and headache. If fever worsens, please return to clinic.   If symptoms do not improve, please ret in 7 days, return to clinic. We will follow up regarding your labs, if any further changes should be made. Presuming this is chicken pox, you are contagious until the last vesicle crusts over.  You are not permitted to work or school until this occurs.

## 2014-03-28 LAB — COMPLETE METABOLIC PANEL WITH GFR
ALK PHOS: 62 U/L (ref 39–117)
ALT: 30 U/L (ref 0–35)
AST: 26 U/L (ref 0–37)
Albumin: 4.1 g/dL (ref 3.5–5.2)
BILIRUBIN TOTAL: 0.4 mg/dL (ref 0.2–1.2)
BUN: 12 mg/dL (ref 6–23)
CO2: 25 mEq/L (ref 19–32)
Calcium: 9.2 mg/dL (ref 8.4–10.5)
Chloride: 100 mEq/L (ref 96–112)
Creat: 0.83 mg/dL (ref 0.50–1.10)
GFR, Est African American: >89 mL/min
Glucose, Bld: 90 mg/dL (ref 70–99)
Potassium: 4 mEq/L (ref 3.5–5.3)
Sodium: 133 mEq/L — ABNORMAL LOW (ref 135–145)
TOTAL PROTEIN: 8.3 g/dL (ref 6.0–8.3)

## 2014-03-28 LAB — SEDIMENTATION RATE: SED RATE: 36 mm/h — AB (ref 0–22)

## 2014-03-28 MED ORDER — HYDROXYZINE HCL 25 MG PO TABS: 25.0000 mg | ORAL_TABLET | Freq: Three times a day (TID) | ORAL | Status: DC | PRN

## 2014-03-28 NOTE — Progress Notes (Addendum)
EKG and CXR read, patient discussed, and independently examined  with Ms. English. Agree with assessment and plan of care per her note. Appears to be varicella, so agree with treatment with valtrex with close follow up by phone or OV.  Out of work for now with contact precautions. RTC precautions discussed. Other labs pending.

## 2014-03-28 NOTE — Addendum Note (Signed)
Addended by: Ivar Drape D on: 03/28/2014 03:16 PM   Modules accepted: Orders

## 2014-03-29 ENCOUNTER — Telehealth: Payer: Self-pay | Admitting: Physician Assistant

## 2014-03-29 NOTE — Telephone Encounter (Signed)
Spoke with Christine Holland.  The vesicles have become more pruritic.  She stated that she woke up with a headache.  She took tylenol for relief which was very successful.  We went over her lab results of her CMET which were normal.  We also talked about establishing primary care at [facility].  She is interested in joining the clinic.  She needs a primary care to followup on her anemia.   -Ordered the hydroxyzine to help with the itching, but also advised not to scratch.   -Will follow up with her in three days to monitor her progress with this most presumed bout of chicken pox.

## 2014-04-02 ENCOUNTER — Ambulatory Visit (INDEPENDENT_AMBULATORY_CARE_PROVIDER_SITE_OTHER): Payer: BC Managed Care – PPO | Admitting: Internal Medicine

## 2014-04-02 VITALS — BP 120/62 | HR 86 | Temp 99.1°F | Resp 16 | Ht 68.5 in | Wt 178.4 lb

## 2014-04-02 DIAGNOSIS — R21 Rash and other nonspecific skin eruption: Secondary | ICD-10-CM

## 2014-04-02 DIAGNOSIS — R51 Headache: Secondary | ICD-10-CM

## 2014-04-02 DIAGNOSIS — R238 Other skin changes: Secondary | ICD-10-CM

## 2014-04-02 DIAGNOSIS — R519 Headache, unspecified: Secondary | ICD-10-CM

## 2014-04-02 NOTE — Patient Instructions (Signed)
The lesions appear to have cleared.  You may return to work.  If you have any new lesions arise, please return to the clinic.

## 2014-04-02 NOTE — Progress Notes (Signed)
Subjective:    Patient ID: Christine Holland, female    DOB: 14-Aug-1985, 28 y.o.   MRN: [id number]  HPI This is a 28 year old female with a history of neutropenia, sickle-cell trait, and a past +ANA, who is here today for a follow up of her severe headache, joint pain, chills, neck pain, and rash.  7 days ago, she appeared at [facility] after four days of these symptoms with an onset of vesicular rash the day before she appeared.  This was being treated for a presumed chicken pox with valtrex 1000mg  TID.  Tomorrow will be her last round of medication.    -Today, she states that she is feeling much better.  She has noticed that the lesions have completely scabbed over.  This occurred about three days ago.  They are rarely pruritic, but when they do, it is along her back.  She has stopped taking the vistaril for itching, as it isn't intolerable.  Her last headache was yesterday, but was very mild in severity.  Acetaminophen completely resolved the symptoms.  She has had no new lesion sightings.  She denies fever, photophobia, or neck pain. -She has not had any chest pains since her visit to [facility].  She denies any dizziness.     Review of Systems  Constitutional: Negative for fever.  Eyes: Negative for photophobia.  Cardiovascular: Negative for chest pain.  Skin: Positive for rash (scabbing rash).  Neurological: Positive for headaches (only a mild headache differenent from the one during the height of her illness). Negative for dizziness.       Objective:   Physical Exam  Constitutional: She is oriented to person, place, and time. She appears well-developed and well-nourished. No distress.  HENT:  Mouth/Throat: No oropharyngeal exudate, posterior oropharyngeal edema or posterior oropharyngeal erythema.  Neck: Normal range of motion. Neck supple.  Cardiovascular: Normal rate, regular rhythm and normal heart sounds.   Pulmonary/Chest: Effort normal and breath sounds normal. No respiratory distress.    Lymphadenopathy:    She has no cervical adenopathy.  Neurological: She is alert and oriented to person, place, and time.  Skin: Skin is warm and dry. Rash (Hyperpigmented serosanguinous crust diffusely sparse along her back and chest.  These lesions are in the same location where I saw the vesicular rash before.  Some additional ones at thre right breast just medial to the aereola.,but scabby appearance too.) noted.  Psychiatric: She has a normal mood and affect. Her behavior is normal. Thought content normal.        Assessment & Plan:  This is a 28 year old female This is a 28 year old female with a history of neutropenia, sickle-cell trait, and a past +ANA, who is here today for a severe headache, joint pain, chills, and neck pain here for follow up regarding vesicular rash with presumed chicken pox.  This looks as though it is in its final step of clearing the chicken pox.  It appears that her wbc and lymphocytes were unaffected by the virus.  She is returning to [facility] to establish primary care in about a month, where her neutropenia, etc., can be monitored and/or referred at that time.    Vesicular rash (Presumed Chicken Pox) Vesicular rash appears to be resolving and acting in character of chickenpox.  She will follow up if the vesicular rash returns after her last round of anti-virals.   Nonintractable headache, unspecified chronicity pattern, unspecified headache type Resolved and stable  Ivar Drape, PA-C  [facility] 10/20/20151:57 PM   I have participated in the care of this patient with the Advanced Practice Provider and agree with Diagnosis and Plan as documented. Robert P. Laney Pastor, M.D.

## 2014-05-01 ENCOUNTER — Encounter: Payer: Self-pay | Admitting: Family Medicine

## 2014-05-01 ENCOUNTER — Ambulatory Visit (INDEPENDENT_AMBULATORY_CARE_PROVIDER_SITE_OTHER): Payer: BC Managed Care – PPO | Admitting: Family Medicine

## 2014-05-01 VITALS — BP 130/70 | HR 78 | Temp 98.3°F | Resp 16 | Ht 68.5 in | Wt 178.8 lb

## 2014-05-01 DIAGNOSIS — D649 Anemia, unspecified: Secondary | ICD-10-CM

## 2014-05-01 DIAGNOSIS — Z113 Encounter for screening for infections with a predominantly sexual mode of transmission: Secondary | ICD-10-CM

## 2014-05-01 DIAGNOSIS — D61818 Other pancytopenia: Secondary | ICD-10-CM

## 2014-05-01 DIAGNOSIS — N898 Other specified noninflammatory disorders of vagina: Secondary | ICD-10-CM

## 2014-05-01 DIAGNOSIS — D696 Thrombocytopenia, unspecified: Secondary | ICD-10-CM

## 2014-05-01 DIAGNOSIS — N6320 Unspecified lump in the left breast, unspecified quadrant: Secondary | ICD-10-CM

## 2014-05-01 DIAGNOSIS — Z124 Encounter for screening for malignant neoplasm of cervix: Secondary | ICD-10-CM

## 2014-05-01 DIAGNOSIS — Z131 Encounter for screening for diabetes mellitus: Secondary | ICD-10-CM

## 2014-05-01 DIAGNOSIS — Z23 Encounter for immunization: Secondary | ICD-10-CM

## 2014-05-01 DIAGNOSIS — D573 Sickle-cell trait: Secondary | ICD-10-CM

## 2014-05-01 DIAGNOSIS — Z13220 Encounter for screening for lipoid disorders: Secondary | ICD-10-CM

## 2014-05-01 DIAGNOSIS — N63 Unspecified lump in breast: Secondary | ICD-10-CM

## 2014-05-01 DIAGNOSIS — D72819 Decreased white blood cell count, unspecified: Secondary | ICD-10-CM

## 2014-05-01 DIAGNOSIS — N6310 Unspecified lump in the right breast, unspecified quadrant: Secondary | ICD-10-CM

## 2014-05-01 LAB — COMPREHENSIVE METABOLIC PANEL
ALBUMIN: 3.9 g/dL (ref 3.5–5.2)
ALK PHOS: 56 U/L (ref 39–117)
ALT: 19 U/L (ref 0–35)
AST: 22 U/L (ref 0–37)
BUN: 11 mg/dL (ref 6–23)
CO2: 26 mEq/L (ref 19–32)
Calcium: 8.8 mg/dL (ref 8.4–10.5)
Chloride: 104 mEq/L (ref 96–112)
Creat: 0.9 mg/dL (ref 0.50–1.10)
GLUCOSE: 84 mg/dL (ref 70–99)
POTASSIUM: 4 meq/L (ref 3.5–5.3)
SODIUM: 137 meq/L (ref 135–145)
TOTAL PROTEIN: 8.2 g/dL (ref 6.0–8.3)
Total Bilirubin: 0.6 mg/dL (ref 0.2–1.2)

## 2014-05-01 LAB — CBC WITH DIFFERENTIAL/PLATELET
BASOS PCT: 0 % (ref 0–1)
Basophils Absolute: 0 10*3/uL (ref 0.0–0.1)
Eosinophils Absolute: 0.1 10*3/uL (ref 0.0–0.7)
Eosinophils Relative: 2 % (ref 0–5)
HEMATOCRIT: 33.6 % — AB (ref 36.0–46.0)
HEMOGLOBIN: 11.2 g/dL — AB (ref 12.0–15.0)
Lymphocytes Relative: 52 % — ABNORMAL HIGH (ref 12–46)
Lymphs Abs: 1.8 10*3/uL (ref 0.7–4.0)
MCH: 25.4 pg — ABNORMAL LOW (ref 26.0–34.0)
MCHC: 33.3 g/dL (ref 30.0–36.0)
MCV: 76.2 fL — ABNORMAL LOW (ref 78.0–100.0)
MONOS PCT: 13 % — AB (ref 3–12)
MPV: 11 fL (ref 9.4–12.4)
Monocytes Absolute: 0.4 10*3/uL (ref 0.1–1.0)
NEUTROS ABS: 1.1 10*3/uL — AB (ref 1.7–7.7)
NEUTROS PCT: 33 % — AB (ref 43–77)
Platelets: 153 10*3/uL (ref 150–400)
RBC: 4.41 MIL/uL (ref 3.87–5.11)
RDW: 14.1 % (ref 11.5–15.5)
WBC: 3.4 10*3/uL — AB (ref 4.0–10.5)

## 2014-05-01 LAB — HEMOGLOBIN A1C
Hgb A1c MFr Bld: 5.7 % — ABNORMAL HIGH (ref <5.7)
MEAN PLASMA GLUCOSE: 117 mg/dL — AB (ref <117)

## 2014-05-01 LAB — POCT WET PREP WITH KOH
KOH PREP POC: NEGATIVE
TRICHOMONAS UA: NEGATIVE
Yeast Wet Prep HPF POC: NEGATIVE

## 2014-05-01 LAB — LIPID PANEL
CHOL/HDL RATIO: 3.6 ratio
Cholesterol: 127 mg/dL (ref 0–200)
HDL: 35 mg/dL — AB (ref >39)
LDL Cholesterol: 77 mg/dL (ref 0–99)
TRIGLYCERIDES: 76 mg/dL (ref <150)
VLDL: 15 mg/dL (ref 0–40)

## 2014-05-01 MED ORDER — METRONIDAZOLE 500 MG PO TABS: 500.0000 mg | ORAL_TABLET | Freq: Two times a day (BID) | ORAL | Status: DC

## 2014-05-01 NOTE — Progress Notes (Signed)
Subjective:    Patient ID: Christine Holland, female    DOB: [birth year], 28 y.o.   MRN: [id number]  05/01/2014  establish care and pap smear   HPI This 28 y.o. female presents to establish care and for Complete Physical Examination.  Last physical:  11/2013 [employer] in [place]. Pap smear:  2013 WNL  Regular menses (4 days, no cramping; first day heavy) Mammogram: ultrasound and mammogram for breast lump at [facility] on [address] every six months since 2012-2013.   Colonoscopy:  never Gardisil not sure. Influenza: agreeable Eye exam: 2015; glasses Dental exam:  Every six months.  Last STD screening 2013.  B breast masses: chronic recurrent issue; s/p breast bx R breast.  Last breast US in 2013. +fibroadenomas and R breast cyst.  Pancytopenia: previously followed by hematology; last office visit 04/17/2012; was to follow-up in one year but did not; needs referral back to hematology.   Review of Systems  Constitutional: Negative for fever, chills, diaphoresis, activity change, appetite change, fatigue and unexpected weight change.  HENT: Negative for congestion, dental problem, drooling, ear discharge, ear pain, facial swelling, hearing loss, mouth sores, nosebleeds, postnasal drip, rhinorrhea, sinus pressure, sneezing, sore throat, tinnitus, trouble swallowing and voice change.   Eyes: Negative for photophobia, pain, discharge, redness, itching and visual disturbance.  Respiratory: Negative for apnea, cough, choking, chest tightness, shortness of breath, wheezing and stridor.   Cardiovascular: Negative for chest pain, palpitations and leg swelling.  Gastrointestinal: Negative for nausea, vomiting, abdominal pain, diarrhea, constipation, blood in stool, abdominal distention, anal bleeding and rectal pain.  Endocrine: Negative for cold intolerance, heat intolerance, polydipsia, polyphagia and polyuria.  Genitourinary: Negative for dysuria, urgency, frequency, hematuria, flank  pain, decreased urine volume, vaginal bleeding, vaginal discharge, enuresis, difficulty urinating, genital sores, vaginal pain, menstrual problem, pelvic pain and dyspareunia.  Musculoskeletal: Negative for myalgias, back pain, joint swelling, arthralgias, gait problem, neck pain and neck stiffness.  Skin: Negative for color change, pallor, rash and wound.  Allergic/Immunologic: Negative for environmental allergies, food allergies and immunocompromised state.  Neurological: Negative for dizziness, tremors, seizures, syncope, facial asymmetry, speech difficulty, weakness, light-headedness, numbness and headaches.  Hematological: Negative for adenopathy. Does not bruise/bleed easily.  Psychiatric/Behavioral: Negative for suicidal ideas, hallucinations, behavioral problems, confusion, sleep disturbance, self-injury, dysphoric mood, decreased concentration and agitation. The patient is not nervous/anxious and is not hyperactive.     No past medical history on file. Past Surgical History  Procedure Laterality Date  . Breast surgery     No Known Allergies Current Outpatient Prescriptions  Medication Sig Dispense Refill  . metroNIDAZOLE (FLAGYL) 500 MG tablet Take 1 tablet (500 mg total) by mouth 2 (two) times daily. 14 tablet 0   No current facility-administered medications for this visit.       Objective:    BP 130/70 mmHg  Pulse 78  Temp(Src) 98.3 F (36.8 C) (Oral)  Resp 16  Ht 5' 8.5" (1.74 m)  Wt 178 lb 12.8 oz (81.103 kg)  BMI 26.79 kg/m2  SpO2 97%  LMP 04/06/2014 Physical Exam  Constitutional: She is oriented to person, place, and time. She appears well-developed and well-nourished. No distress.  HENT:  Head: Normocephalic and atraumatic.  Right Ear: External ear normal.  Left Ear: External ear normal.  Nose: Nose normal.  Mouth/Throat: Oropharynx is clear and moist.  Eyes: Conjunctivae and EOM are normal. Pupils are equal, round, and reactive to light.  Neck: Normal  range of motion and full passive  range of motion without pain. Neck supple. No JVD present. Carotid bruit is not present. No thyromegaly present.  Cardiovascular: Normal rate, regular rhythm and normal heart sounds.  Exam reveals no gallop and no friction rub.   No murmur heard. Pulmonary/Chest: Effort normal and breath sounds normal. She has no wheezes. She has no rales. Right breast exhibits mass. Right breast exhibits no inverted nipple, no nipple discharge, no skin change and no tenderness. Left breast exhibits mass. Left breast exhibits no inverted nipple, no nipple discharge, no skin change and no tenderness. Breasts are symmetrical.  Abdominal: Soft. Bowel sounds are normal. She exhibits no distension and no mass. There is no tenderness. There is no rebound and no guarding.  Genitourinary: Uterus normal. There is no rash, tenderness or lesion on the right labia. There is no rash, tenderness or lesion on the left labia. Cervix exhibits no motion tenderness, no discharge and no friability. Right adnexum displays no mass, no tenderness and no fullness. Left adnexum displays no mass, no tenderness and no fullness. No erythema or bleeding in the vagina. No foreign body around the vagina. Vaginal discharge found.  Musculoskeletal:       Right shoulder: Normal.       Left shoulder: Normal.       Cervical back: Normal.  Lymphadenopathy:    She has no cervical adenopathy.  Neurological: She is alert and oriented to person, place, and time. She has normal reflexes. No cranial nerve deficit. She exhibits normal muscle tone. Coordination normal.  Skin: Skin is warm and dry. No rash noted. She is not diaphoretic. No erythema. No pallor.  Psychiatric: She has a normal mood and affect. Her behavior is normal. Judgment and thought content normal.  Nursing note and vitals reviewed.  INFLUENZA VACCINE ADMINISTERED.     Assessment & Plan:   1. Cervical cancer screening   2. Screening for STD (sexually  transmitted disease)   3. Leukocytopenia   4. Thrombocytopenia   5. Anemia, unspecified anemia type   6. Sickle cell trait   7. Screening for diabetes mellitus   8. Screening, lipid   9. Vaginal discharge   10. Masses of both breasts   11. Flu vaccine need   12. Pancytopenia     1. Cervical cancer screening:  Pap smear obtained.  Safe sexual practices reviewed during visit; currently NOT sexually active; not interested in contraception at this time. 2.  Screening for STDs: verbal consent obtained; obtain GC/Chlam/RPR/HIV. 3.  Leukocytopenia: stable; repeat labs today; if persistent, refer back to hematology.   4.  Thrombocytopenia: intermittent issue for patient; repeat today. 5.  Anemia: chronic; with known sickle cell trait; repeat today; if persists, will refer back to hematology. 6.  Screening DMII: obtain glucose, HgbA1c. 7. Screening lipid: obtain FLP. 8.  Bacterial vaginosis/vaginal discharge: new. Rx for Metronidazole provided.  9. Masses B breasts: Chronic; history of fibroadenomas B breasts; refer for diagnostic mammograms and [place] to confirm stability.  If stable, return to screening at age 24. 89.  S/p flu vaccine.     Meds ordered this encounter  Medications  . metroNIDAZOLE (FLAGYL) 500 MG tablet    Sig: Take 1 tablet (500 mg total) by mouth 2 (two) times daily.    Dispense:  14 tablet    Refill:  0    No Follow-up on file.    Reginia Forts, M.D.  [facility] [address] [place], [place]  [address] [phone]  phone [phone] fax

## 2014-05-01 NOTE — Patient Instructions (Signed)

## 2014-05-02 LAB — HIV ANTIBODY (ROUTINE TESTING W REFLEX): HIV 1&2 Ab, 4th Generation: NONREACTIVE

## 2014-05-02 LAB — RPR

## 2014-05-03 LAB — PAP IG, CT-NG NAA, HPV HIGH-RISK
CHLAMYDIA PROBE AMP: NEGATIVE
GC PROBE AMP: NEGATIVE
HPV DNA High Risk: NOT DETECTED

## 2014-05-11 ENCOUNTER — Encounter: Payer: Self-pay | Admitting: Family Medicine

## 2014-06-14 DIAGNOSIS — D560 Alpha thalassemia: Secondary | ICD-10-CM

## 2014-06-14 HISTORY — DX: Alpha thalassemia: D56.0

## 2014-06-19 ENCOUNTER — Other Ambulatory Visit: Payer: BC Managed Care – PPO | Admitting: Lab

## 2014-06-19 ENCOUNTER — Ambulatory Visit: Payer: BC Managed Care – PPO | Admitting: Family

## 2014-06-19 ENCOUNTER — Telehealth: Payer: Self-pay | Admitting: Hematology & Oncology

## 2014-06-19 ENCOUNTER — Ambulatory Visit: Payer: BC Managed Care – PPO

## 2014-06-19 NOTE — Telephone Encounter (Signed)
Pt moved 1-6 to 1-20

## 2014-07-02 ENCOUNTER — Telehealth: Payer: Self-pay | Admitting: Hematology & Oncology

## 2014-07-02 NOTE — Telephone Encounter (Signed)
Left vm w NEW PATIENT today to remind them of their appointment with Dr. Ennever. Also, advised them to bring all medication bottles and insurance card information. ° °

## 2014-07-03 ENCOUNTER — Other Ambulatory Visit (HOSPITAL_BASED_OUTPATIENT_CLINIC_OR_DEPARTMENT_OTHER): Payer: BLUE CROSS/BLUE SHIELD | Admitting: Lab

## 2014-07-03 ENCOUNTER — Ambulatory Visit: Payer: Self-pay

## 2014-07-03 ENCOUNTER — Ambulatory Visit
Admission: RE | Admit: 2014-07-03 | Discharge: 2014-07-03 | Disposition: A | Discharge status: Home / Self Care | Payer: BLUE CROSS/BLUE SHIELD | Source: Ambulatory Visit | Attending: Family Medicine | Admitting: Family Medicine

## 2014-07-03 ENCOUNTER — Ambulatory Visit (HOSPITAL_BASED_OUTPATIENT_CLINIC_OR_DEPARTMENT_OTHER): Payer: BLUE CROSS/BLUE SHIELD | Admitting: Family

## 2014-07-03 ENCOUNTER — Other Ambulatory Visit: Payer: Self-pay | Admitting: Family

## 2014-07-03 ENCOUNTER — Encounter: Payer: Self-pay | Admitting: Family

## 2014-07-03 DIAGNOSIS — N6310 Unspecified lump in the right breast, unspecified quadrant: Secondary | ICD-10-CM

## 2014-07-03 DIAGNOSIS — N6320 Unspecified lump in the left breast, unspecified quadrant: Principal | ICD-10-CM

## 2014-07-03 DIAGNOSIS — D61818 Other pancytopenia: Secondary | ICD-10-CM | POA: Insufficient documentation

## 2014-07-03 DIAGNOSIS — D560 Alpha thalassemia: Secondary | ICD-10-CM

## 2014-07-03 LAB — CBC WITH DIFFERENTIAL (CANCER CENTER ONLY)
BASO#: 0 10*3/uL (ref 0.0–0.2)
BASO%: 0.3 % (ref 0.0–2.0)
EOS ABS: 0 10*3/uL (ref 0.0–0.5)
EOS%: 0.5 % (ref 0.0–7.0)
HCT: 33.9 % — ABNORMAL LOW (ref 34.8–46.6)
HGB: 11.3 g/dL — ABNORMAL LOW (ref 11.6–15.9)
LYMPH#: 1.6 10*3/uL (ref 0.9–3.3)
LYMPH%: 44.3 % (ref 14.0–48.0)
MCH: 25.8 pg — ABNORMAL LOW (ref 26.0–34.0)
MCHC: 33.3 g/dL (ref 32.0–36.0)
MCV: 77 fL — AB (ref 81–101)
MONO#: 0.5 10*3/uL (ref 0.1–0.9)
MONO%: 14.1 % — ABNORMAL HIGH (ref 0.0–13.0)
NEUT%: 40.8 % (ref 39.6–80.0)
NEUTROS ABS: 1.5 10*3/uL (ref 1.5–6.5)
PLATELETS: 134 10*3/uL — AB (ref 145–400)
RBC: 4.38 10*6/uL (ref 3.70–5.32)
RDW: 12.4 % (ref 11.1–15.7)
WBC: 3.7 10*3/uL — AB (ref 3.9–10.0)

## 2014-07-03 LAB — CHCC SATELLITE - SMEAR

## 2014-07-03 MED ORDER — FOLIC ACID 1 MG PO TABS: 1.0000 mg | ORAL_TABLET | Freq: Every day | ORAL | Status: DC

## 2014-07-03 NOTE — Progress Notes (Signed)
Hematology/Oncology Consultation   Name: MACALL MCCROSKEY      MRN: [id number]    Location: Room/bed info not found  Date: 07/03/2014 Time:3:09 PM   REFERRING PHYSICIAN: Steffanie Dunn M Smith  REASON FOR CONSULT:  Pancytopenia   DIAGNOSIS:  1. Mild Pancytopenia 2. Alpha-thalassemia  HISTORY OF PRESENT ILLNESS:  Ms. Emrich is a very pleasant 29 yo African American female with mild pancytopenia and also alpha-thalassemia.  She has been seen by Dr. Beryle Beams in 2013 and was recently switched by [facility] to Dr. Marin Olp. She has no complaints or symptoms at this time.  She has the sickle cell trait as do her father and uncle. She has no personal or familial history of cancer. No family history of bleeding or clotting.  She immigrated with her family from the [place] in [place] to [place] in 2009. Once being established with a PCP it was founf that she had the mild pancytopenia.  Her cycles are normal.  She denies fatigue, fever, chills, n/v, cough, rash, headache, dizziness, SOB, chest pain, palpitations, abdominal pain, constipation, diarrhea, blood in urine or stool.  No swelling, tenderness, numbness or tingling in her extremities.  Her appetite is good and she is staying hydrated. Her weight is stable at 169 lbs.  She had malaria as a child and recently had chicken pox in October.  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 Today's ultrasound showed probable fibroadenomata in the 6 o'clock region of the right breast, 12 o'clock region of the right breast and 1 o'clock region of the left breast. She has ultrasounds every 6 months. She prefers this rather than a biopsy at this time.  She does not smoke or drink alcohol.  She works in Therapist, art.   ROS: All other 10 point review of systems is negative.   PAST MEDICAL HISTORY:   Past Medical History  Diagnosis Date  . Sickle cell trait   . Anemia   . Leukocytopenia     s/p hematology consultation 2013.  . Bilateral  fibroadenomas of breasts     ALLERGIES: No Known Allergies    MEDICATIONS:  No current outpatient prescriptions on file prior to visit.   No current facility-administered medications on file prior to visit.     PAST SURGICAL HISTORY Past Surgical History  Procedure Laterality Date  . Breast surgery      FAMILY HISTORY: Family History  Problem Relation Age of Onset  . Hypertension Mother   . Hypertension Father     SOCIAL HISTORY:  reports that she has never smoked. She has never used smokeless tobacco. She reports that she does not drink alcohol or use illicit drugs.  PERFORMANCE STATUS: The patient's performance status is 0 - Asymptomatic  PHYSICAL EXAM: Most Recent Vital Signs: Blood pressure 107/55, pulse 62, temperature 97.9 F (36.6 C), temperature source Oral, resp. rate 14, height 5\' 8"  (1.727 m), weight 169 lb (76.658 kg). BP 107/55 mmHg  Pulse 62  Temp(Src) 97.9 F (36.6 C) (Oral)  Resp 14  Ht 5\' 8"  (1.727 m)  Wt 169 lb (76.658 kg)  BMI 25.70 kg/m2  General Appearance:    Alert, cooperative, no distress, appears stated age  Head:    Normocephalic, without obvious abnormality, atraumatic  Eyes:    PERRL, conjunctiva/corneas clear, EOM's intact, fundi    benign, both eyes        Throat:   Lips, mucosa, and tongue normal; teeth and gums normal  Neck:   Supple,  symmetrical, trachea midline, no adenopathy;    thyroid:  no enlargement/tenderness/nodules; no carotid   bruit or JVD  Back:     Symmetric, no curvature, ROM normal, no CVA tenderness  Lungs:     Clear to auscultation bilaterally, respirations unlabored  Chest Wall:    No tenderness or deformity   Heart:    Regular rate and rhythm, S1 and S2 normal, no murmur, rub   or gallop     Abdomen:     Soft, non-tender, bowel sounds active all four quadrants,    no masses, no organomegaly        Extremities:   Extremities normal, atraumatic, no cyanosis or edema  Pulses:   2+ and symmetric all  extremities  Skin:   Skin color, texture, turgor normal, no rashes or lesions  Lymph nodes:   Cervical, supraclavicular, and axillary nodes normal  Neurologic:   CNII-XII intact, normal strength, sensation and reflexes    throughout    LABORATORY DATA:  Results for orders placed or performed in visit on 07/03/14 (from the past 48 hour(s))  CBC with Differential [facility])     Status: Abnormal   Collection Time: 07/03/14  2:22 PM  Result Value Ref Range   WBC 3.7 (L) 3.9 - 10.0 10e3/uL   RBC 4.38 3.70 - 5.32 10e6/uL   HGB 11.3 (L) 11.6 - 15.9 g/dL   HCT 33.9 (L) 34.8 - 46.6 %   MCV 77 (L) 81 - 101 fL   MCH 25.8 (L) 26.0 - 34.0 pg   MCHC 33.3 32.0 - 36.0 g/dL   RDW 12.4 11.1 - 15.7 %   Platelets 134 (L) 145 - 400 10e3/uL   NEUT# 1.5 1.5 - 6.5 10e3/uL   LYMPH# 1.6 0.9 - 3.3 10e3/uL   MONO# 0.5 0.1 - 0.9 10e3/uL   Eosinophils Absolute 0.0 0.0 - 0.5 10e3/uL   BASO# 0.0 0.0 - 0.2 10e3/uL   NEUT% 40.8 39.6 - 80.0 %   LYMPH% 44.3 14.0 - 48.0 %   MONO% 14.1 (H) 0.0 - 13.0 %   EOS% 0.5 0.0 - 7.0 %   BASO% 0.3 0.0 - 2.0 %  [facility] - Smear     Status: None   Collection Time: 07/03/14  2:22 PM  Result Value Ref Range   Smear Result Smear Available       RADIOGRAPHY:     PATHOLOGY: None  ASSESSMENT/PLAN: Ms. Huq is a very pleasant 29 yo African American female with mild pancytopenia and alpha-thalassemia. She is also positive for sickle cell trait. She has no complaints or symptoms at this time.  She will start taking Folic acid 1 mg daily.  Today her WBC is 3.7, Hgb 11.3, MCV 77 and platelets 134. We will see what the rest of her labs show. Dr. Marin Olp will view her smear on Monday. We will decide at that time when we need to schedule her follow-up appointment.  All questions were answered. She knows to call the clinic with any problems, questions or concerns. We can certainly see her much sooner if necessary.  [facility] M

## 2014-07-04 LAB — FERRITIN: Ferritin: 38 ng/mL (ref 10–291)

## 2014-07-04 LAB — IRON AND TIBC
%SAT: 18 % — AB (ref 20–55)
Iron: 56 ug/dL (ref 42–145)
TIBC: 306 ug/dL (ref 250–470)
UIBC: 250 ug/dL (ref 125–400)

## 2014-07-11 LAB — ALPHA-THALASSEMIA GENOTYPR

## 2014-07-12 ENCOUNTER — Telehealth: Payer: Self-pay | Admitting: Hematology & Oncology

## 2014-07-12 ENCOUNTER — Other Ambulatory Visit: Payer: Self-pay | Admitting: Family

## 2014-07-12 DIAGNOSIS — D61818 Other pancytopenia: Secondary | ICD-10-CM

## 2014-07-12 DIAGNOSIS — D573 Sickle-cell trait: Secondary | ICD-10-CM

## 2014-07-12 NOTE — Telephone Encounter (Signed)
Mailed march schedule

## 2014-09-12 ENCOUNTER — Other Ambulatory Visit: Payer: BLUE CROSS/BLUE SHIELD

## 2014-09-12 ENCOUNTER — Ambulatory Visit: Payer: BLUE CROSS/BLUE SHIELD | Admitting: Hematology & Oncology

## 2014-09-12 ENCOUNTER — Telehealth: Payer: Self-pay | Admitting: Hematology & Oncology

## 2014-09-12 NOTE — Telephone Encounter (Signed)
Called pt to reschedule from missed appointment today she said she would call back to reschedule

## 2015-05-26 ENCOUNTER — Ambulatory Visit (INDEPENDENT_AMBULATORY_CARE_PROVIDER_SITE_OTHER): Payer: Managed Care, Other (non HMO) | Admitting: Physician Assistant

## 2015-05-26 ENCOUNTER — Other Ambulatory Visit: Payer: Self-pay | Admitting: Physician Assistant

## 2015-05-26 VITALS — BP 112/64 | HR 80 | Temp 98.2°F | Resp 16 | Ht 68.0 in | Wt 183.0 lb

## 2015-05-26 DIAGNOSIS — R590 Localized enlarged lymph nodes: Secondary | ICD-10-CM

## 2015-05-26 DIAGNOSIS — R599 Enlarged lymph nodes, unspecified: Secondary | ICD-10-CM | POA: Diagnosis not present

## 2015-05-26 DIAGNOSIS — D649 Anemia, unspecified: Secondary | ICD-10-CM

## 2015-05-26 LAB — POCT RAPID STREP A (OFFICE): RAPID STREP A SCREEN: NEGATIVE

## 2015-05-26 MED ORDER — NAPROXEN 500 MG PO TABS: 500.0000 mg | ORAL_TABLET | Freq: Two times a day (BID) | ORAL | Status: DC

## 2015-05-26 NOTE — Progress Notes (Signed)
05/30/2015 12:15 AM   DOB: 1985/12/28 / MRN: [id number]  SUBJECTIVE:  Christine Holland is a 29 y.o. female presenting for the evaluation of left sided tonsillar pain that started 3 days ago.  Associated symptoms include runny nose and congestion today and she denies fever, cough, difficulty breathing, headache and jaw pain. Treatments tried thus far include OTC cold preparations with poor relief. She reports sick contacts. States the pain is worse with drinking cold water.  The pain is transient.  She has a history of sickle cell trait.    She has No Known Allergies.   She  has a past medical history of Sickle cell trait ([place]); Anemia; Leukocytopenia; and Bilateral fibroadenomas of breasts.    She  reports that she has never smoked. She has never used smokeless tobacco. She reports that she does not drink alcohol or use illicit drugs. She  has no sexual activity history on file. The patient  has past surgical history that includes Breast surgery.  Her family history includes Hypertension in her father and mother.  Review of Systems  Constitutional: Negative for fever and chills.  Eyes: Negative for blurred vision.  Respiratory: Negative for cough and shortness of breath.   Cardiovascular: Negative for chest pain.  Gastrointestinal: Negative for nausea and abdominal pain.  Genitourinary: Negative for dysuria, urgency and frequency.  Musculoskeletal: Negative for myalgias.  Skin: Negative for rash.  Neurological: Negative for dizziness, tingling and headaches.  Psychiatric/Behavioral: Negative for depression. The patient is not nervous/anxious.     Problem list and medications reviewed and updated by myself where necessary, and exist elsewhere in the encounter.   OBJECTIVE:  BP 112/64 mmHg  Pulse 80  Temp(Src) 98.2 F (36.8 C) (Oral)  Resp 16  Ht 5\' 8"  (1.727 m)  Wt 183 lb (83.008 kg)  BMI 27.83 kg/m2  SpO2 99%  LMP 05/09/2015 CrCl cannot be calculated (Patient has no serum  creatinine result on file.).  Results for orders placed or performed in visit on 05/26/15  Culture, Group A Strep  Result Value Ref Range   Organism ID, Bacteria Normal Upper Respiratory Flora    Organism ID, Bacteria No Beta Hemolytic Streptococci Isolated   CBC with Differential/Platelet  Result Value Ref Range   WBC 4.5 4.0 - 10.5 K/uL   RBC 4.45 3.87 - 5.11 MIL/uL   Hemoglobin 11.4 (L) 12.0 - 15.0 g/dL   HCT 34.3 (L) 36.0 - 46.0 %   MCV 77.1 (L) 78.0 - 100.0 fL   MCH 25.6 (L) 26.0 - 34.0 pg   MCHC 33.2 30.0 - 36.0 g/dL   RDW 13.6 11.5 - 15.5 %   Platelets 115 (L) 150 - 400 K/uL   Neutrophils Relative % 42 (L) 43 - 77 %   Neutro Abs 1.9 1.7 - 7.7 K/uL   Lymphocytes Relative 43 12 - 46 %   Lymphs Abs 1.9 0.7 - 4.0 K/uL   Monocytes Relative 14 (H) 3 - 12 %   Monocytes Absolute 0.6 0.1 - 1.0 K/uL   Eosinophils Relative 1 0 - 5 %   Eosinophils Absolute 0.0 0.0 - 0.7 K/uL   Basophils Relative 0 0 - 1 %   Basophils Absolute 0.0 0.0 - 0.1 K/uL   Smear Review SEE NOTE   POCT rapid strep A  Result Value Ref Range   Rapid Strep A Screen Negative Negative     Physical Exam  Constitutional: She is oriented to person, place, and time. She appears  well-developed.  HENT:  Head: Normocephalic.  Right Ear: Hearing and tympanic membrane normal.  Left Ear: Hearing and tympanic membrane normal.  Nose: Nose normal.  Mouth/Throat: Uvula is midline, oropharynx is clear and moist and mucous membranes are normal.  Eyes: EOM are normal. Pupils are equal, round, and reactive to light.  Cardiovascular: Normal rate and regular rhythm.   Pulmonary/Chest: Effort normal and breath sounds normal.  Abdominal: She exhibits no distension.  Musculoskeletal: Normal range of motion.  Lymphadenopathy:       Head (right side): No tonsillar adenopathy present.       Head (left side): Tonsillar (minimal) adenopathy present.    She has no cervical adenopathy.  Neurological: She is alert and oriented to  person, place, and time. No cranial nerve deficit.  Skin: Skin is warm and dry. She is not diaphoretic.  Psychiatric: She has a normal mood and affect.  Vitals reviewed.  No results found for this or any previous visit (from the past 48 hour(s)).  ASSESSMENT AND PLAN  Casey Burkitt was seen today for neck pain.  Diagnoses and all orders for this visit:  Lymphadenopathy of head and neck region: Her exam is normal. She is able to eat and drink without difficulty. If this problem persist more than three weeks total will order an image.   -     Cancel: POCT CBC -     POCT rapid strep A -     Culture, Group A Strep -     CBC with Differential/Platelet -     naproxen (NAPROSYN) 500 MG tablet; Take 1 tablet (500 mg total) by mouth 2 (two) times daily with a meal.    The patient was advised to call or return to clinic if she does not see an improvement in symptoms or to seek the care of the closest emergency department if she worsens with the above plan.   Philis Fendt, MHS, PA-C [facility] 05/30/2015 12:15 AM

## 2015-05-27 LAB — CBC WITH DIFFERENTIAL/PLATELET
Basophils Absolute: 0 10*3/uL (ref 0.0–0.1)
Basophils Relative: 0 % (ref 0–1)
EOS PCT: 1 % (ref 0–5)
Eosinophils Absolute: 0 10*3/uL (ref 0.0–0.7)
HCT: 34.3 % — ABNORMAL LOW (ref 36.0–46.0)
HEMOGLOBIN: 11.4 g/dL — AB (ref 12.0–15.0)
LYMPHS ABS: 1.9 10*3/uL (ref 0.7–4.0)
LYMPHS PCT: 43 % (ref 12–46)
MCH: 25.6 pg — ABNORMAL LOW (ref 26.0–34.0)
MCHC: 33.2 g/dL (ref 30.0–36.0)
MCV: 77.1 fL — AB (ref 78.0–100.0)
MONO ABS: 0.6 10*3/uL (ref 0.1–1.0)
MONOS PCT: 14 % — AB (ref 3–12)
NEUTROS ABS: 1.9 10*3/uL (ref 1.7–7.7)
Neutrophils Relative %: 42 % — ABNORMAL LOW (ref 43–77)
Platelets: 115 10*3/uL — ABNORMAL LOW (ref 150–400)
RBC: 4.45 MIL/uL (ref 3.87–5.11)
RDW: 13.6 % (ref 11.5–15.5)
WBC: 4.5 10*3/uL (ref 4.0–10.5)

## 2015-05-28 LAB — CULTURE, GROUP A STREP: Organism ID, Bacteria: NORMAL

## 2015-05-29 LAB — IRON AND TIBC
%SAT: 16 % (ref 11–50)
Iron: 53 ug/dL (ref 40–190)
TIBC: 325 ug/dL (ref 250–450)
UIBC: 272 ug/dL (ref 125–400)

## 2015-05-29 LAB — FERRITIN: Ferritin: 58 ng/mL (ref 10–291)

## 2015-06-04 NOTE — Addendum Note (Signed)
Addended by: Constance Goltz on: 06/04/2015 08:38 AM   Modules accepted: Orders

## 2015-11-19 ENCOUNTER — Other Ambulatory Visit: Payer: Self-pay | Admitting: Physician Assistant

## 2015-11-19 ENCOUNTER — Ambulatory Visit (INDEPENDENT_AMBULATORY_CARE_PROVIDER_SITE_OTHER): Payer: Managed Care, Other (non HMO) | Admitting: Physician Assistant

## 2015-11-19 VITALS — BP 118/62 | HR 90 | Temp 99.7°F | Resp 18

## 2015-11-19 DIAGNOSIS — R509 Fever, unspecified: Secondary | ICD-10-CM | POA: Diagnosis not present

## 2015-11-19 DIAGNOSIS — R110 Nausea: Secondary | ICD-10-CM | POA: Diagnosis not present

## 2015-11-19 DIAGNOSIS — R070 Pain in throat: Secondary | ICD-10-CM

## 2015-11-19 LAB — POCT CBC
GRANULOCYTE PERCENT: 83.8 % — AB (ref 37–80)
HEMATOCRIT: 32.3 % — AB (ref 37.7–47.9)
Hemoglobin: 10.7 g/dL — AB (ref 12.2–16.2)
LYMPH, POC: 0.9 (ref 0.6–3.4)
MCH, POC: 26.1 pg — AB (ref 27–31.2)
MCHC: 33.2 g/dL (ref 31.8–35.4)
MCV: 78.8 fL — AB (ref 80–97)
MID (cbc): 0.6 (ref 0–0.9)
MPV: 9.1 fL (ref 0–99.8)
POC GRANULOCYTE: 7.8 — AB (ref 2–6.9)
POC LYMPH %: 9.6 % — AB (ref 10–50)
POC MID %: 6.6 %M (ref 0–12)
Platelet Count, POC: 112 10*3/uL — AB (ref 142–424)
RBC: 4.1 M/uL (ref 4.04–5.48)
RDW, POC: 12.5 %
WBC: 9.3 10*3/uL (ref 4.6–10.2)

## 2015-11-19 LAB — POCT RAPID STREP A (OFFICE): Rapid Strep A Screen: NEGATIVE

## 2015-11-19 LAB — POCT INFLUENZA A/B
Influenza A, POC: NEGATIVE
Influenza B, POC: NEGATIVE

## 2015-11-19 LAB — POCT URINALYSIS DIP (MANUAL ENTRY)
BILIRUBIN UA: NEGATIVE
Bilirubin, UA: NEGATIVE
Blood, UA: NEGATIVE
Glucose, UA: NEGATIVE
LEUKOCYTES UA: NEGATIVE
Nitrite, UA: NEGATIVE
PROTEIN UA: NEGATIVE
Spec Grav, UA: 1.015
UROBILINOGEN UA: 0.2
pH, UA: 8.5

## 2015-11-19 LAB — POC MICROSCOPIC URINALYSIS (UMFC): MUCUS RE: ABSENT

## 2015-11-19 MED ORDER — ONDANSETRON 8 MG PO TBDP: 8.0000 mg | ORAL_TABLET | Freq: Three times a day (TID) | ORAL | Status: DC | PRN

## 2015-11-19 MED ORDER — IBUPROFEN 200 MG PO TABS
400.0000 mg | ORAL_TABLET | Freq: Once | ORAL | Status: AC
  Administered 2015-11-19: 400 mg via ORAL

## 2015-11-19 MED ORDER — IBUPROFEN 200 MG PO TABS
200.0000 mg | ORAL_TABLET | Freq: Once | ORAL | Status: AC
  Administered 2015-11-19: 200 mg via ORAL

## 2015-11-19 NOTE — Patient Instructions (Addendum)
IF you received an x-ray today, you will receive an invoice from [facility]. Please contact [facility] at [phone] with questions or concerns regarding your invoice.   IF you received labwork today, you will receive an invoice from [employer]. Please contact [facility] at [phone] with questions or concerns regarding your invoice.   Our billing staff will not be able to assist you with questions regarding bills from these companies.  You will be contacted with the lab results as soon as they are available. The fastest way to get your results is to activate your My Chart account. Instructions are located on the last page of this paperwork. If you have not heard from [place] regarding the results in 2 weeks, please contact this office.    Please hydrate well with water at least 64 oz or more per day. Please take tylenol or ibuprofen every 6-8 hours respectively. Let's see you back in 2 days for follow up, unless miraculously feeling better. Take the tylenol or ibuprofen for the throat pain.   cepacol lozenges for the throat pain. Fever, Adult A fever is an increase in the body's temperature. It is often defined as a temperature of 100 F (38C) or higher. Short mild or moderate fevers often have no long-term effects. They also often do not need treatment. Moderate or high fevers may make you feel uncomfortable. Sometimes, they can also be a sign of a serious illness or disease. The sweating that may happen with repeated fevers or fevers that last a while may also cause you to not have enough fluid in your body (dehydration). You can take your temperature with a thermometer to see if you have a fever. A measured temperature can change with:  Age.  Time of day.  Where the thermometer is placed:  Mouth (oral).  Rectum (rectal).  Ear (tympanic).  Underarm (axillary).  Forehead (temporal). HOME CARE Pay attention to any changes in  your symptoms. Take these actions to help with your condition:  Take over-the-counter and prescription medicines only as told by your doctor. Follow the dosing instructions carefully.  If you were prescribed an antibiotic medicine, take it as told by your doctor. Do not stop taking the antibiotic even if you start to feel better.  Rest as needed.  Drink enough fluid to keep your pee (urine) clear or pale yellow.  Sponge yourself or bathe with room-temperature water as needed. This helps to lower your body temperature . Do not use ice water.  Do not wear too many blankets or heavy clothes. GET HELP IF:  You throw up (vomit).  You cannot eat or drink without throwing up.  You have watery poop (diarrhea).  It hurts when you pee.  Your symptoms do not get better with treatment.  You have new symptoms.  You feel very weak. GET HELP RIGHT AWAY IF:  You are short of breath or have trouble breathing.  You are dizzy or you pass out (faint).  You feel confused.  You have signs of not having enough fluid in your body, such as:  A dry mouth.  Peeing less.  Looking pale.  You have very bad pain in your belly (abdomen).  You keep throwing up or having water poop.  You have a skin rash.  Your symptoms suddenly get worse.   This information is not intended to replace advice given to you by your health care provider. Make sure you discuss any questions you have with  your health care provider.   Document Released: 03/09/2008 Document Revised: 02/19/2015 Document Reviewed: 07/25/2014 Elsevier Interactive Patient Education [employer].

## 2015-11-19 NOTE — Progress Notes (Addendum)
[facility] [address] [place] [address] [phone]  Date:  11/19/2015   Name:  Christine Holland   DOB:  30-Sep-1985   MRN:  [id number]  PCP:  Reginia Forts, MD    History of Present Illness:  Christine Holland is a 30 y.o. female patient who presents to [facility] for cc of fever. Patient symptoms began with a headache, and progressed into fever and chills. She reported a neck stiffness. She has also had nausea. There is no photosensitivity. She has body aches and chills. Her throat is painful and hurts to swallow.  She denies a cough. There is no dysuria, hematuria, or frequency. She has no shortness of breath or trouble breathing. She took an antipruritic in the morning, but has not had any in the last 8 hours. She has had no appetite. Her hydration is very minimal today. She is feeling very dizzy. No rash or joint swelling. No outdoor activity. No history of insect or tick bites. She works at a call center. History of sickle cell trait.    Patient Active Problem List   Diagnosis Date Noted  . Pancytopenia ([place]) 07/03/2014  . [place], BREAST 11/16/2009  . ANA POSITIVE 10/05/2009  . URINALYSIS, ABNORMAL 07/29/2009  . SICKLE-CELL TRAIT 07/06/2009  . OTHER NEUTROPENIA 07/06/2009  . BREAST TENDERNESS 06/10/2009  . BREAST MASS, RIGHT 06/10/2009    Past Medical History  Diagnosis Date  . Sickle cell trait ([place])   . Anemia   . Leukocytopenia     s/p hematology consultation 2013.  . Bilateral fibroadenomas of breasts     Past Surgical History  Procedure Laterality Date  . Breast surgery      Social History  Substance Use Topics  . Smoking status: Never Smoker   . Smokeless tobacco: Never Used     Comment: NEVER USED TOBACCO  . Alcohol Use: No    Family History  Problem Relation Age of Onset  . Hypertension Mother   . Hypertension Father     No Known Allergies  Medication list has been reviewed and updated.  Current Outpatient Prescriptions  on File Prior to Visit  Medication Sig Dispense Refill  . folic acid (FOLVITE) 1 MG tablet Take 1 tablet (1 mg total) by mouth daily. (Patient not taking: Reported on 05/26/2015) 30 tablet 11  . naproxen (NAPROSYN) 500 MG tablet Take 1 tablet (500 mg total) by mouth 2 (two) times daily with a meal. (Patient not taking: Reported on 11/19/2015) 30 tablet 0  . UNKNOWN TO PATIENT Reported on 11/19/2015     No current facility-administered medications on file prior to visit.    ROS ROS otherwise unremarkable unless listed above.  Physical Examination: BP 99/64 mmHg  Pulse 118  Temp(Src) 102.2 F (39 C) (Oral)  Resp 18  SpO2 100%  LMP 11/07/2015 Ideal Body Weight:    Physical Exam  Constitutional: She is oriented to person, place, and time. She appears well-developed and well-nourished. No distress.  HENT:  Head: Normocephalic and atraumatic.  Right Ear: Tympanic membrane, external ear and ear canal normal.  Left Ear: Tympanic membrane, external ear and ear canal normal.  Nose: Mucosal edema and rhinorrhea present. Right sinus exhibits no maxillary sinus tenderness and no frontal sinus tenderness. Left sinus exhibits no maxillary sinus tenderness and no frontal sinus tenderness.  Mouth/Throat: No uvula swelling. No oropharyngeal exudate, posterior oropharyngeal edema or posterior oropharyngeal erythema.  Eyes: Conjunctivae and EOM are normal. Pupils are equal,  round, and reactive to light.  Neck: Thyromegaly present.  Cardiovascular: Normal rate and regular rhythm.  Exam reveals no gallop, no distant heart sounds and no friction rub.   No murmur heard. Pulmonary/Chest: Effort normal. No apnea. No respiratory distress. She has no decreased breath sounds. She has no wheezes. She has no rhonchi.  Abdominal: Soft. Normal appearance. There is no hepatosplenomegaly. There is no tenderness. There is no tenderness at McBurney's point and negative Murphy's sign.  Lymphadenopathy:       Head (right  side): No submandibular, no tonsillar, no preauricular and no posterior auricular adenopathy present.       Head (left side): No submandibular, no tonsillar, no preauricular and no posterior auricular adenopathy present.    She has no cervical adenopathy.  Neurological: She is alert and oriented to person, place, and time.  Negative brudzinski  Skin: She is not diaphoretic.  Psychiatric: She has a normal mood and affect. Her behavior is normal.   Results for orders placed or performed in visit on 11/19/15  TSH  Result Value Ref Range   TSH 0.38 (L) mIU/L  POCT CBC  Result Value Ref Range   WBC 9.3 4.6 - 10.2 K/uL   Lymph, poc 0.9 0.6 - 3.4   POC LYMPH PERCENT 9.6 (A) 10 - 50 %L   MID (cbc) 0.6 0 - 0.9   POC MID % 6.6 0 - 12 %M   POC Granulocyte 7.8 (A) 2 - 6.9   Granulocyte percent 83.8 (A) 37 - 80 %G   RBC 4.10 4.04 - 5.48 M/uL   Hemoglobin 10.7 (A) 12.2 - 16.2 g/dL   HCT, POC 32.3 (A) 37.7 - 47.9 %   MCV 78.8 (A) 80 - 97 fL   MCH, POC 26.1 (A) 27 - 31.2 pg   MCHC 33.2 31.8 - 35.4 g/dL   RDW, POC 12.5 %   Platelet Count, POC 112 (A) 142 - 424 K/uL   MPV 9.1 0 - 99.8 fL  POCT Influenza A/B  Result Value Ref Range   Influenza A, POC Negative Negative   Influenza B, POC Negative Negative  POCT rapid strep A  Result Value Ref Range   Rapid Strep A Screen Negative Negative  POCT urinalysis dipstick  Result Value Ref Range   Color, UA yellow yellow   Clarity, UA clear clear   Glucose, UA negative negative   Bilirubin, UA negative negative   Ketones, POC UA negative negative   Spec Grav, UA 1.015    Blood, UA negative negative   pH, UA 8.5    Protein Ur, POC negative negative   Urobilinogen, UA 0.2    Nitrite, UA Negative Negative   Leukocytes, UA Negative Negative  POCT Microscopic Urinalysis ([facility])  Result Value Ref Range   WBC,UR,HPF,POC None None WBC/hpf   RBC,UR,HPF,POC None None RBC/hpf   Bacteria None None, Too numerous to count   Mucus Absent Absent    Epithelial Cells, UR Per Microscopy Few (A) None, Too numerous to count cells/hpf    Assessment and Plan: [name] is a 30 y.o. female who is here todayfor chief complaint of fever and bodyaches. Differential diagnosis includes influenza, viral meningitis, strep throat This appears to be likely viral without hydrating properly. Orthostats were very concerning plunging to 89/50.Marland Kitchen After heavy hydration her blood pressure is of normal and she is looking much better.  Temperature has resolved to a 98.8. I've advised her heavy hydration and diuretic use swapping I  have ibuprofen and Tylenol consistently. I will send it for culture. I will also recheck her thyroid though this fever may not be consistent with a thyroid toxicosis. I have advised I have advised her to return for follow-up. Fever, unspecified - Plan: ibuprofen (ADVIL,MOTRIN) tablet 400 mg, POCT CBC, POCT Influenza A/B, POCT rapid strep A, Culture, Group A Strep, ibuprofen (ADVIL,MOTRIN) tablet 200 mg, POCT urinalysis dipstick, POCT Microscopic Urinalysis ([facility]), TSH  Throat pain - Plan: POCT rapid strep A, Culture, Group A Strep, POCT urinalysis dipstick, POCT Microscopic Urinalysis ([facility]), TSH  Nausea without vomiting - Plan: ondansetron (ZOFRAN-ODT) 8 MG disintegrating tablet  Ivar Drape, PA-C [facility] 11/19/2015 5:04 PM   Addendum: TSH consistent with hyperthyroidism.  Advised lab to add full thyroid profile

## 2015-11-19 NOTE — Progress Notes (Signed)
IV placed in the left antecubital per Ms. English VO.  Philis Fendt, MS, PA-C 6:14 PM, 11/19/2015

## 2015-11-20 LAB — TSH: TSH: 0.38 m[IU]/L — AB

## 2015-11-21 ENCOUNTER — Ambulatory Visit (INDEPENDENT_AMBULATORY_CARE_PROVIDER_SITE_OTHER): Payer: Managed Care, Other (non HMO) | Admitting: Physician Assistant

## 2015-11-21 ENCOUNTER — Ambulatory Visit (INDEPENDENT_AMBULATORY_CARE_PROVIDER_SITE_OTHER): Payer: Managed Care, Other (non HMO)

## 2015-11-21 VITALS — BP 124/78 | HR 95 | Temp 97.8°F | Resp 17 | Ht 69.0 in | Wt 177.0 lb

## 2015-11-21 DIAGNOSIS — R059 Cough, unspecified: Secondary | ICD-10-CM

## 2015-11-21 DIAGNOSIS — D72829 Elevated white blood cell count, unspecified: Secondary | ICD-10-CM

## 2015-11-21 DIAGNOSIS — R509 Fever, unspecified: Secondary | ICD-10-CM

## 2015-11-21 DIAGNOSIS — R05 Cough: Secondary | ICD-10-CM

## 2015-11-21 DIAGNOSIS — J069 Acute upper respiratory infection, unspecified: Secondary | ICD-10-CM

## 2015-11-21 DIAGNOSIS — B9789 Other viral agents as the cause of diseases classified elsewhere: Principal | ICD-10-CM

## 2015-11-21 LAB — POCT CBC
GRANULOCYTE PERCENT: 82.3 % — AB (ref 37–80)
HEMATOCRIT: 31.9 % — AB (ref 37.7–47.9)
Hemoglobin: 11 g/dL — AB (ref 12.2–16.2)
Lymph, poc: 1.5 (ref 0.6–3.4)
MCH: 26.5 pg — AB (ref 27–31.2)
MCHC: 34.6 g/dL (ref 31.8–35.4)
MCV: 76.7 fL — AB (ref 80–97)
MID (CBC): 0.3 (ref 0–0.9)
MPV: 9.2 fL (ref 0–99.8)
PLATELET COUNT, POC: 115 10*3/uL — AB (ref 142–424)
POC GRANULOCYTE: 8.7 — AB (ref 2–6.9)
POC LYMPH %: 14.5 % (ref 10–50)
POC MID %: 3.2 %M (ref 0–12)
RBC: 4.15 M/uL (ref 4.04–5.48)
RDW, POC: 11.5 %
WBC: 10.6 10*3/uL — AB (ref 4.6–10.2)

## 2015-11-21 LAB — CULTURE, GROUP A STREP: Organism ID, Bacteria: NORMAL

## 2015-11-21 LAB — POCT URINE PREGNANCY: Preg Test, Ur: NEGATIVE

## 2015-11-21 MED ORDER — AZITHROMYCIN 250 MG PO TABS: ORAL_TABLET | ORAL | Status: DC

## 2015-11-21 NOTE — Patient Instructions (Signed)
     IF you received an x-ray today, you will receive an invoice from [facility]. Please contact [facility] at [phone] with questions or concerns regarding your invoice.   IF you received labwork today, you will receive an invoice from [facility]. Please contact [facility] at [phone] with questions or concerns regarding your invoice.   Our billing staff will not be able to assist you with questions regarding bills from these companies.  You will be contacted with the lab results as soon as they are available. The fastest way to get your results is to activate your My Chart account. Instructions are located on the last page of this paperwork. If you have not heard from us regarding the results in 2 weeks, please contact this office.      

## 2015-11-21 NOTE — Progress Notes (Signed)
11/21/2015 11:56 AM   DOB: August 06, 1985 / MRN: [id number]  SUBJECTIVE:  Christine Holland is a 30 y.o. female presenting for a recheck of fever and cough.  She was seen by Ms. English 2 days previous for same and required IV fluids for same.  Today she reports feeling somewhat better, however did have fever of 102 last night which was relieved with Advil.  She has also been taking benadryl which is helping with nasal congestion.  She feels like she is getting better.   She has No Known Allergies.   She  has a past medical history of Sickle cell trait ([place]); Anemia; Leukocytopenia; and Bilateral fibroadenomas of breasts.    She  reports that she has never smoked. She has never used smokeless tobacco. She reports that she does not drink alcohol or use illicit drugs. She  has no sexual activity history on file. The patient  has past surgical history that includes Breast surgery.  Her family history includes Hypertension in her father and mother.  Review of Systems  Constitutional: Positive for fever.  Respiratory: Positive for cough. Negative for hemoptysis.   Neurological: Negative for dizziness, weakness and headaches.    Problem list and medications reviewed and updated by myself where necessary, and exist elsewhere in the encounter.   OBJECTIVE:  BP 124/78 mmHg  Pulse 95  Temp(Src) 97.8 F (36.6 C) (Oral)  Resp 17  Ht 5\' 9"  (1.753 m)  Wt 177 lb (80.287 kg)  BMI 26.13 kg/m2  SpO2 100%  LMP 11/07/2015  Physical Exam  Constitutional: She is oriented to person, place, and time. She appears well-developed.  Eyes: EOM are normal. Pupils are equal, round, and reactive to light.  Cardiovascular: Normal rate.   Pulmonary/Chest: Effort normal.  Abdominal: She exhibits no distension.  Musculoskeletal: Normal range of motion.  Neurological: She is alert and oriented to person, place, and time. No cranial nerve deficit.  Skin: Skin is warm and dry. She is not diaphoretic.  Psychiatric: She  has a normal mood and affect.  Vitals reviewed.   Results for orders placed or performed in visit on 11/21/15 (from the past 72 hour(s))  POCT CBC     Status: Abnormal   Collection Time: 11/21/15 10:54 AM  Result Value Ref Range   WBC 10.6 (A) 4.6 - 10.2 K/uL   Lymph, poc 1.5 0.6 - 3.4   POC LYMPH PERCENT 14.5 10 - 50 %L   MID (cbc) 0.3 0 - 0.9   POC MID % 3.2 0 - 12 %M   POC Granulocyte 8.7 (A) 2 - 6.9   Granulocyte percent 82.3 (A) 37 - 80 %G   RBC 4.15 4.04 - 5.48 M/uL   Hemoglobin 11.0 (A) 12.2 - 16.2 g/dL   HCT, POC 31.9 (A) 37.7 - 47.9 %   MCV 76.7 (A) 80 - 97 fL   MCH, POC 26.5 (A) 27 - 31.2 pg   MCHC 34.6 31.8 - 35.4 g/dL   RDW, POC 11.5 %   Platelet Count, POC 115 (A) 142 - 424 K/uL   MPV 9.2 0 - 99.8 fL  POCT urine pregnancy     Status: None   Collection Time: 11/21/15 11:15 AM  Result Value Ref Range   Preg Test, Ur Negative Negative    Dg Chest 2 View  11/21/2015  CLINICAL DATA:  Cough, chest pain and leukocytosis. No time course given. EXAM: CHEST  2 VIEW COMPARISON:  03/27/2014. FINDINGS: The heart size and  mediastinal contours are within normal limits. Both lungs are clear. The visualized skeletal structures are unremarkable. IMPRESSION: Normal chest x-ray. Electronically Signed   By: Marijo Sanes M.D.   On: 11/21/2015 11:37    ASSESSMENT AND PLAN  .Casey Burkitt was seen today for follow-up.  Diagnoses and all orders for this visit:  Cough: It is hard to know exactly what's going on with her.  He illness overall appears viral however she is mounting a granulocyte count.  Will start azithromycin to preclude a CAP.  Return in 48 hours if no improvement, sooner if worse.  -     POCT CBC -     DG Chest 2 View; Future -     POCT urine pregnancy  Leukocytosis -     azithromycin (ZITHROMAX) 250 MG tablet; Take two on day one and one every day thereafter.      The patient was advised to call or return to clinic if she does not see an improvement in symptoms or  to seek the care of the closest emergency department if she worsens with the above plan.   Philis Fendt, MHS, PA-C [facility] 11/21/2015 11:56 AM

## 2015-11-24 LAB — T3 UPTAKE: T3 Uptake: 34 % (ref 22–35)

## 2015-11-24 LAB — T4, FREE: FREE T4: 0.9 ng/dL (ref 0.8–1.8)

## 2015-11-24 LAB — T4: T4 TOTAL: 6.9 ug/dL (ref 4.5–12.0)

## 2015-12-25 ENCOUNTER — Ambulatory Visit (INDEPENDENT_AMBULATORY_CARE_PROVIDER_SITE_OTHER): Payer: Managed Care, Other (non HMO) | Admitting: Urgent Care

## 2015-12-25 VITALS — BP 108/56 | HR 96 | Temp 98.4°F | Resp 16 | Ht 68.0 in | Wt 182.6 lb

## 2015-12-25 DIAGNOSIS — J029 Acute pharyngitis, unspecified: Secondary | ICD-10-CM | POA: Diagnosis not present

## 2015-12-25 DIAGNOSIS — R05 Cough: Secondary | ICD-10-CM | POA: Diagnosis not present

## 2015-12-25 DIAGNOSIS — R059 Cough, unspecified: Secondary | ICD-10-CM

## 2015-12-25 LAB — HIV ANTIBODY (ROUTINE TESTING W REFLEX): HIV 1&2 Ab, 4th Generation: NONREACTIVE

## 2015-12-25 LAB — CBC
HEMATOCRIT: 33.2 % — AB (ref 35.0–45.0)
Hemoglobin: 10.9 g/dL — ABNORMAL LOW (ref 11.7–15.5)
MCH: 25.2 pg — ABNORMAL LOW (ref 27.0–33.0)
MCHC: 32.8 g/dL (ref 32.0–36.0)
MCV: 76.7 fL — ABNORMAL LOW (ref 80.0–100.0)
MPV: 12.1 fL (ref 7.5–12.5)
Platelets: 186 10*3/uL (ref 140–400)
RBC: 4.33 MIL/uL (ref 3.80–5.10)
RDW: 13.8 % (ref 11.0–15.0)
WBC: 4.1 10*3/uL (ref 3.8–10.8)

## 2015-12-25 MED ORDER — CETIRIZINE HCL 10 MG PO TABS: 10.0000 mg | ORAL_TABLET | Freq: Every day | ORAL | Status: DC

## 2015-12-25 MED ORDER — AMOXICILLIN 875 MG PO TABS: 875.0000 mg | ORAL_TABLET | Freq: Two times a day (BID) | ORAL | Status: DC

## 2015-12-25 NOTE — Patient Instructions (Addendum)
Sore Throat A sore throat is pain, burning, irritation, or scratchiness of the throat. There is often pain or tenderness when swallowing or talking. A sore throat may be accompanied by other symptoms, such as coughing, sneezing, fever, and swollen neck glands. A sore throat is often the first sign of another sickness, such as a cold, flu, strep throat, or mononucleosis (commonly known as mono). Most sore throats go away without medical treatment. CAUSES  The most common causes of a sore throat include:  A viral infection, such as a cold, flu, or mono.  A bacterial infection, such as strep throat, tonsillitis, or whooping cough.  Seasonal allergies.  Dryness in the air.  Irritants, such as smoke or pollution.  Gastroesophageal reflux disease (GERD). HOME CARE INSTRUCTIONS   Only take over-the-counter medicines as directed by your caregiver.  Drink enough fluids to keep your urine clear or pale yellow.  Rest as needed.  Try using throat sprays, lozenges, or sucking on hard candy to ease any pain (if older than 4 years or as directed).  Sip warm liquids, such as broth, herbal tea, or warm water with honey to relieve pain temporarily. You may also eat or drink cold or frozen liquids such as frozen ice pops.  Gargle with salt water (mix 1 tsp salt with 8 oz of water).  Do not smoke and avoid secondhand smoke.  Put a cool-mist humidifier in your bedroom at night to moisten the air. You can also turn on a hot shower and sit in the bathroom with the door closed for 5-10 minutes. SEEK IMMEDIATE MEDICAL CARE IF:  You have difficulty breathing.  You are unable to swallow fluids, soft foods, or your saliva.  You have increased swelling in the throat.  Your sore throat does not get better in 7 days.  You have nausea and vomiting.  You have a fever or persistent symptoms for more than 2-3 days.  You have a fever and your symptoms suddenly get worse. MAKE SURE YOU:   Understand  these instructions.  Will watch your condition.  Will get help right away if you are not doing well or get worse.   This information is not intended to replace advice given to you by your health care provider. Make sure you discuss any questions you have with your health care provider.   Document Released: 07/08/2004 Document Revised: 06/21/2014 Document Reviewed: 02/06/2012 Elsevier Interactive Patient Education 2016 [employer].     IF you received an x-ray today, you will receive an invoice from [facility]. Please contact [facility] at [phone] with questions or concerns regarding your invoice.   IF you received labwork today, you will receive an invoice from [employer]. Please contact [facility] at [phone] with questions or concerns regarding your invoice.   Our billing staff will not be able to assist you with questions regarding bills from these companies.  You will be contacted with the lab results as soon as they are available. The fastest way to get your results is to activate your My Chart account. Instructions are located on the last page of this paperwork. If you have not heard from [place] regarding the results in 2 weeks, please contact this office.

## 2015-12-25 NOTE — Progress Notes (Signed)
    MRN: [id number] DOB: 05/10/1986  Subjective:   Christine Holland is a 30 y.o. female presenting for chief complaint of Sore Throat  Reports ~1 week history of sore throat, has intermittent sharp pain in upper throat, difficulty swallowing. Also has had some left ear pain, intermittent productive cough. Has had ongoing throat discomfort for the past year. Has not tried any medications for this episode. Denies fever, sinus pain, sinus congestion, ear drainage, chest pain, shob, n/v, abdominal pain, rashes, fatigue, night sweats. Patient has not traveled outside of the [place] in the past year.   Christine Holland has a current medication list which includes the following prescription(s): acetaminophen and ibuprofen. Also has No Known Allergies.  Christine Holland  has a past medical history of Sickle cell trait ([place]); Anemia; Leukocytopenia; and Bilateral fibroadenomas of breasts. Also  has past surgical history that includes Breast surgery.  Objective:   Vitals: BP 108/56 mmHg  Pulse 96  Temp(Src) 98.4 F (36.9 C) (Oral)  Resp 16  Ht 5\' 8"  (1.727 m)  Wt 182 lb 9.6 oz (82.827 kg)  BMI 27.77 kg/m2  SpO2 99%  LMP 12/09/2015  Wt Readings from Last 3 Encounters:  12/25/15 182 lb 9.6 oz (82.827 kg)  11/21/15 177 lb (80.287 kg)  05/26/15 183 lb (83.008 kg)    Physical Exam  Constitutional: She is oriented to person, place, and time. She appears well-developed and well-nourished.  HENT:  TM's intact bilaterally, no effusions or erythema. Nasal turbinates pink and moist, nasal passages patent. No sinus tenderness. Oropharynx clear, mucous membranes moist, dentition in good repair.  Eyes: Pupils are equal, round, and reactive to light. Right eye exhibits no discharge. Left eye exhibits no discharge. No scleral icterus.  Neck: Normal range of motion. Neck supple.  Cardiovascular: Normal rate, regular rhythm and intact distal pulses.  Exam reveals no gallop and no friction rub.   No murmur  heard. Pulmonary/Chest: No respiratory distress. She has no wheezes. She has no rales.  Lymphadenopathy:    She has no cervical adenopathy.  Neurological: She is alert and oriented to person, place, and time.  Skin: Skin is warm and dry.   Assessment and Plan :   1. Sore throat 2. Cough - Labs pending, start Amoxicillin to address infectious etiology. Also start Zyrtec to address allergy component. Referral to ENT pending. Patient agreed with plan.  Jaynee Eagles, PA-C [facility] [phone] 12/25/2015 10:46 AM

## 2015-12-26 LAB — QUANTIFERON TB GOLD ASSAY (BLOOD)
INTERFERON GAMMA RELEASE ASSAY: NEGATIVE
QUANTIFERON NIL VALUE: 0.03 [IU]/mL
Quantiferon Tb Ag Minus Nil Value: 0 IU/mL

## 2015-12-26 LAB — EPSTEIN-BARR VIRUS VCA ANTIBODY PANEL
EBV NA IgG: 28.2 U/mL — ABNORMAL HIGH
EBV VCA IgG: 182 U/mL — ABNORMAL HIGH
EBV VCA IgM: <36 U/mL

## 2016-01-06 NOTE — Progress Notes (Signed)
error 

## 2016-01-20 DIAGNOSIS — J029 Acute pharyngitis, unspecified: Secondary | ICD-10-CM | POA: Insufficient documentation

## 2016-02-25 ENCOUNTER — Telehealth: Payer: Self-pay | Admitting: *Deleted

## 2016-02-25 DIAGNOSIS — R7989 Other specified abnormal findings of blood chemistry: Secondary | ICD-10-CM

## 2016-02-25 NOTE — Telephone Encounter (Signed)
-----   Message from Joretta Bachelor, [place] sent at 02/23/2016  9:10 AM EDT ----- Labs were normal, and you were sick with a virus when I saw you.  I think we should recheck your thyroid at this time. With a thyroid panel.  If she is agreeable please order a "thyroid panel with tsh" for future order.

## 2016-02-25 NOTE — Telephone Encounter (Signed)
Patient notified of results and she will make an appointment for CPE tomorrow and will have it done then.  Future order placed for thyroid panel with tsh.

## 2016-02-26 ENCOUNTER — Ambulatory Visit (INDEPENDENT_AMBULATORY_CARE_PROVIDER_SITE_OTHER): Payer: Managed Care, Other (non HMO) | Admitting: Family Medicine

## 2016-02-26 VITALS — BP 112/72 | HR 82 | Temp 98.0°F | Resp 17 | Ht 68.0 in | Wt 186.0 lb

## 2016-02-26 DIAGNOSIS — Z0000 Encounter for general adult medical examination without abnormal findings: Secondary | ICD-10-CM | POA: Diagnosis not present

## 2016-02-26 DIAGNOSIS — Z136 Encounter for screening for cardiovascular disorders: Secondary | ICD-10-CM

## 2016-02-26 DIAGNOSIS — Z131 Encounter for screening for diabetes mellitus: Secondary | ICD-10-CM | POA: Diagnosis not present

## 2016-02-26 DIAGNOSIS — R946 Abnormal results of thyroid function studies: Secondary | ICD-10-CM

## 2016-02-26 DIAGNOSIS — R7989 Other specified abnormal findings of blood chemistry: Secondary | ICD-10-CM

## 2016-02-26 LAB — POCT URINALYSIS DIP (MANUAL ENTRY)
BILIRUBIN UA: NEGATIVE
BILIRUBIN UA: NEGATIVE
GLUCOSE UA: NEGATIVE
Leukocytes, UA: NEGATIVE
Nitrite, UA: NEGATIVE
Protein Ur, POC: NEGATIVE
RBC UA: NEGATIVE
SPEC GRAV UA: 1.02
UROBILINOGEN UA: 0.2
pH, UA: 7

## 2016-02-26 LAB — CBC WITH DIFFERENTIAL/PLATELET
BASOS ABS: 0 {cells}/uL (ref 0–200)
Basophils Relative: 0 %
EOS ABS: 49 {cells}/uL (ref 15–500)
Eosinophils Relative: 1 %
HCT: 35.8 % (ref 35.0–45.0)
HEMOGLOBIN: 11.8 g/dL (ref 11.7–15.5)
LYMPHS ABS: 1960 {cells}/uL (ref 850–3900)
Lymphocytes Relative: 40 %
MCH: 25.4 pg — AB (ref 27.0–33.0)
MCHC: 33 g/dL (ref 32.0–36.0)
MCV: 77.2 fL — AB (ref 80.0–100.0)
MONO ABS: 490 {cells}/uL (ref 200–950)
Monocytes Relative: 10 %
NEUTROS ABS: 2401 {cells}/uL (ref 1500–7800)
Neutrophils Relative %: 49 %
Platelets: 148 10*3/uL (ref 140–400)
RBC: 4.64 MIL/uL (ref 3.80–5.10)
RDW: 13.7 % (ref 11.0–15.0)
WBC: 4.9 10*3/uL (ref 3.8–10.8)

## 2016-02-26 LAB — LIPID PANEL
CHOL/HDL RATIO: 3.2 ratio (ref <=5.0)
Cholesterol: 146 mg/dL (ref 125–200)
HDL: 46 mg/dL (ref >=46)
LDL Cholesterol: 87 mg/dL (ref <130)
Triglycerides: 65 mg/dL (ref <150)
VLDL: 13 mg/dL (ref <30)

## 2016-02-26 LAB — COMPLETE METABOLIC PANEL WITH GFR
ALBUMIN: 3.9 g/dL (ref 3.6–5.1)
ALK PHOS: 53 U/L (ref 33–115)
ALT: 16 U/L (ref 6–29)
AST: 18 U/L (ref 10–30)
BUN: 9 mg/dL (ref 7–25)
CALCIUM: 9.1 mg/dL (ref 8.6–10.2)
CO2: 25 mmol/L (ref 20–31)
Chloride: 103 mmol/L (ref 98–110)
Creat: 0.76 mg/dL (ref 0.50–1.10)
Glucose, Bld: 76 mg/dL (ref 65–99)
POTASSIUM: 3.9 mmol/L (ref 3.5–5.3)
SODIUM: 136 mmol/L (ref 135–146)
Total Bilirubin: 0.7 mg/dL (ref 0.2–1.2)
Total Protein: 8 g/dL (ref 6.1–8.1)

## 2016-02-26 LAB — POC MICROSCOPIC URINALYSIS (UMFC): MUCUS RE: ABSENT

## 2016-02-26 NOTE — Patient Instructions (Addendum)
Return for gynecological exam in November 2018.    IF you received an x-ray today, you will receive an invoice from [facility]. Please contact [facility] at [phone] with questions or concerns regarding your invoice.   IF you received labwork today, you will receive an invoice from [employer]. Please contact [facility] at [phone] with questions or concerns regarding your invoice.   Our billing staff will not be able to assist you with questions regarding bills from these companies.  You will be contacted with the lab results as soon as they are available. The fastest way to get your results is to activate your My Chart account. Instructions are located on the last page of this paperwork. If you have not heard from [place] regarding the results in 2 weeks, please contact this office.      Exercising to Stay Healthy Exercising regularly is important. It has many health benefits, such as:  Improving your overall fitness, flexibility, and endurance.  Increasing your bone density.  Helping with weight control.  Decreasing your body fat.  Increasing your muscle strength.  Reducing stress and tension.  Improving your overall health. In order to become healthy and stay healthy, it is recommended that you do moderate-intensity and vigorous-intensity exercise. You can tell that you are exercising at a moderate intensity if you have a higher heart rate and faster breathing, but you are still able to hold a conversation. You can tell that you are exercising at a vigorous intensity if you are breathing much harder and faster and cannot hold a conversation while exercising. HOW OFTEN SHOULD I EXERCISE? Choose an activity that you enjoy and set realistic goals. Your health care provider can help you to make an activity plan that works for you. Exercise regularly as directed by your health care provider. This may include:   Doing resistance training  twice each week, such as:  Push-ups.  Sit-ups.  Lifting weights.  Using resistance bands.  Doing a given intensity of exercise for a given amount of time. Choose from these options:  150 minutes of moderate-intensity exercise every week.  75 minutes of vigorous-intensity exercise every week.  A mix of moderate-intensity and vigorous-intensity exercise every week. Children, pregnant women, people who are out of shape, people who are overweight, and older adults may need to consult a health care provider for individual recommendations. If you have any sort of medical condition, be sure to consult your health care provider before starting a new exercise program.  WHAT ARE SOME EXERCISE IDEAS? Some moderate-intensity exercise ideas include:   Walking at a rate of 1 mile in 15 minutes.  Biking.  Hiking.  Golfing.  Dancing. Some vigorous-intensity exercise ideas include:   Walking at a rate of at least 4.5 miles per hour.  Jogging or running at a rate of 5 miles per hour.  Biking at a rate of at least 10 miles per hour.  Lap swimming.  Roller-skating or in-line skating.  Cross-country skiing.  Vigorous competitive sports, such as football, basketball, and soccer.  Jumping rope.  Aerobic dancing. WHAT ARE SOME EVERYDAY ACTIVITIES THAT CAN HELP ME TO GET EXERCISE?  Yard work, such as:  Psychologist, educational.  Raking and bagging leaves.  Washing and waxing your car.  Pushing a stroller.  Shoveling snow.  Gardening.  Washing windows or floors. HOW CAN I BE MORE ACTIVE IN MY DAY-TO-DAY ACTIVITIES?  Use the stairs instead of the elevator.  Take a walk during  your lunch break.  If you drive, park your car farther away from work or school.  If you take public transportation, get off one stop early and walk the rest of the way.  Make all of your phone calls while standing up and walking around.  Get up, stretch, and walk around every 30 minutes throughout  the day. WHAT GUIDELINES SHOULD I FOLLOW WHILE EXERCISING?  Do not exercise so much that you hurt yourself, feel dizzy, or get very short of breath.  Consult your health care provider before starting a new exercise program.  Wear comfortable clothes and shoes with good support.  Drink plenty of water while you exercise to prevent dehydration or heat stroke. Body water is lost during exercise and must be replaced.  Work out until you breathe faster and your heart beats faster.   This information is not intended to replace advice given to you by your health care provider. Make sure you discuss any questions you have with your health care provider.   Document Released: 07/03/2010 Document Revised: 06/21/2014 Document Reviewed: 11/01/2013 Elsevier Interactive Patient Education [employer].

## 2016-02-26 NOTE — Progress Notes (Signed)
Patient ID: Christine Holland, female    DOB: 1985-11-22, 30 y.o.   MRN: [id number]  PCP: Reginia Forts, MD  Chief Complaint  Patient presents with  . Annual Exam    Subjective:   HPI 30 year old female presents for an annual physical exam. Patient denies any current issues or health concerns.  She works full-time and is currently not sexually active. She reports that she does experience occasional headaches which are relieved adequately with over the counter medications.Reports adequate rest, sufficient meal intake.   She reports an overall healthy family with her mom only suffering from hypertension. She reports her last menstrual period date as August 25th. Duration of menses is 3 days with moderate flow.  Social History   Social History  . Marital status: Single    Spouse name: N/A  . Number of children: N/A  . Years of education: N/A   Occupational History  . Not on file.   Social History Main Topics  . Smoking status: Never Smoker  . Smokeless tobacco: Never Used     Comment: NEVER USED TOBACCO  . Alcohol use No  . Drug use: No  . Sexual activity: Not on file   Other Topics Concern  . Not on file   Social History Narrative   Marital status: single; dating seriously x 5 years; happy; no abuse.  From [place], [place]; [place] since 2009.      Children:  None      Lives: with parent/mom, brothers.      Employment:  Works for [employer] in [place]; bilingual Economist.      Tobacco: none      Alcohol: none      Drugs: none      Exercise:  [employer]      Sexual activity:  None; last sexual activity 2009; total partners = 1; no STDs.     Family History  Problem Relation Age of Onset  . Hypertension Mother   . Hypertension Father    Review of Systems HPI  Patient Active Problem List   Diagnosis Date Noted  . [place], BREAST 11/16/2009  . ANA POSITIVE 10/05/2009  . URINALYSIS, ABNORMAL 07/29/2009  . SICKLE-CELL TRAIT 07/06/2009  .  OTHER NEUTROPENIA 07/06/2009  . BREAST TENDERNESS 06/10/2009  . BREAST MASS, RIGHT 06/10/2009     Prior to Admission medications   Medication Sig Start Date End Date Taking? Authorizing Provider  acetaminophen (TYLENOL) 500 MG tablet Take 500 mg by mouth every 6 (six) hours as needed.   Yes Historical Provider, MD  ibuprofen (ADVIL,MOTRIN) 200 MG tablet Take 200 mg by mouth every 6 (six) hours as needed. Reported on 12/25/2015   Yes Historical Provider, MD   No Known Allergies     Objective:  Physical Exam  Constitutional: She appears well-developed and well-nourished.  HENT:  Head: Normocephalic and atraumatic.  Right Ear: External ear normal.  Left Ear: External ear normal.  Nose: Nose normal.  Mouth/Throat: Oropharynx is clear and moist.  Eyes: Conjunctivae and EOM are normal. Pupils are equal, round, and reactive to light.  Neck: Normal range of motion. Neck supple.  Cardiovascular: Normal rate, regular rhythm, normal heart sounds and intact distal pulses.   Pulmonary/Chest: Effort normal and breath sounds normal.  Abdominal: Soft. Bowel sounds are normal.  Musculoskeletal: Normal range of motion.  Neurological: She is alert. She has normal reflexes.  Skin: Skin is warm and dry.  Psychiatric: She has a normal mood and  affect. Her behavior is normal. Judgment and thought content normal.    Vitals:   02/26/16 1142  BP: 112/72  Pulse: 82  Resp: 17  Temp: 98 F (36.7 C)   Assessment & Plan:  1. Annual physical exam Age-appropriate anticipatory guidance provided   2. Decreased thyroid stimulating hormone (TSH) level - Thyroid Panel With TSH-unremarkable   3. Screening for cardiovascular condition - CBC with Differential/Platelet-unremarkable  - Lipid panel-unremarkable  4. Screening for diabetes mellitus - POCT Microscopic Urinalysis ([facility])-unremarkable  - POCT urinalysis dipstick-unremarkable  - COMPLETE METABOLIC PANEL WITH GFR-unremarkable  Return in 12  months for physical exam.  Carroll Sage. Kenton Kingfisher, MSN, FNP-C [facility]

## 2016-02-27 ENCOUNTER — Encounter: Payer: Self-pay | Admitting: Family Medicine

## 2016-02-27 LAB — THYROID PANEL WITH TSH
Free Thyroxine Index: 2.1 (ref 1.4–3.8)
T3 Uptake: 32 % (ref 22–35)
T4, Total: 6.5 ug/dL (ref 4.5–12.0)
TSH: 0.41 m[IU]/L

## 2016-06-10 DIAGNOSIS — J392 Other diseases of pharynx: Secondary | ICD-10-CM | POA: Insufficient documentation

## 2016-07-16 DIAGNOSIS — J352 Hypertrophy of adenoids: Secondary | ICD-10-CM | POA: Diagnosis not present

## 2016-08-02 ENCOUNTER — Encounter (HOSPITAL_BASED_OUTPATIENT_CLINIC_OR_DEPARTMENT_OTHER): Payer: Self-pay | Admitting: *Deleted

## 2016-08-05 ENCOUNTER — Ambulatory Visit: Payer: Self-pay | Admitting: Otolaryngology

## 2016-08-05 NOTE — H&P (Signed)
Otolaryngology Clinic Note  HPI:      Chief Complaint  Patient presents with  . swollen throat/unable to talk/ dr bates pt    Christine Holland is a 31 y.o. female patient of Wardell Honour, MD for evaluation of recurrent sore throat and headache. She was last seen by Dr. Redmond Baseman 01/20/16. For the past year, she experiences episodic sore throat with headache, neck pain, hoarseness and nasal congestion. Symptoms are currently present for three days. Fever of 102; has dropped to about 100. She is taking Tylenol and throat lozenges. She does get heartburn on occasion and uses antacids. No recent antibiotics but she has been treated with multiple rounds this year. In June she had a negative chest x-ray. No head imaging. No history of recurrent sinusitis that she is aware of.   Non-smoker. No PMH of infectious diseases such as HIV. HIV testing negative recently.  From [place]; lived in the [place] since 2009.   PMH/Meds/All/SocHx/FamHx/ROS:   Past Medical History  History reviewed. No pertinent past medical history.    Past Surgical History  History reviewed. No pertinent surgical history.    No family history of bleeding disorders, wound healing problems or difficulty with anesthesia.   Social History  Social History        Social History  . Marital status: N/A    Spouse name: N/A  . Number of children: N/A  . Years of education: N/A      Occupational History  . Not on file.       Social History Main Topics  . Smoking status: Never Smoker  . Smokeless tobacco: Never Used  . Alcohol use Not on file  . Drug use: Unknown  . Sexual activity: Not on file       Other Topics Concern  . Not on file      Social History Narrative  . No narrative on file      No current outpatient prescriptions on file.  A complete ROS was performed with pertinent positives/negatives noted in the HPI. The remainder of the ROS are negative.    Physical Exam:    There  were no vitals taken for this visit.   General Awake, at baseline alertness during examination.  Voice Hoarse  Eyes No scleral icterus or conjunctival hemorrhage. Globe position appears normal. EOMI.   Right Ear EAC patent, TM intact w/o inflammation. Middle ear well aerated.   Left Ear EAC patent, TM intact w/o inflammation. Middle ear well aerated.   Nose Patent, no polyps or masses seen on anterior rhinoscopy.  Oral cavity No mucosal lesions or tumors seen. Tongue midline.   Oropharynx Symmetric tonsils, 1+. Oropharynx without erythema or exudate.   Larynx (indirect mirror exam) Vocal cords with good mobility; no mass, nodule or polyp. Base of tongue normal and symmetric.  Neck No abnormal cervical lymphadenopathy. No thyromegaly. No thyroid masses palpated.  Cardio-vascular No cyanosis.  Pulmonary No audible stridor. Breathing easily with no labor.  Neuro Symmetric facial movement.   Psychiatry Appropriate affect and mood for clinic visit.    Independent Review of Additional Tests or Records:  Medical records.  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 No active sinus disease.  __________________________________________  Flexible Laryngoscopy Procedure Note:  Date: 06/09/16  Indications: adenoid hypertrophy  Risks, benefits and clinical relevance of the study were discussed. The patient understands and agrees to proceed.   Procedure: After adequate topical anesthetic was applied, 4 mm flexible laryngoscope was  passed through the nasal cavity without difficulty. Flexible laryngoscopy shows patent anterior nasal cavity. There is yellow tinged mucopurulent discharge noted at the moderately sized adenoids.  Normal base of tongue and supraglottis Normal vocal cord mobility without vocal cord nodule, mass, polyp or tumor. Hypopharynx normal without mass, pooling of secretions or aspiration.  Patient tolerated procedure without complication or  difficulty.   Jolene Provost, PA-C Dr. Erik Obey, MD present for exam.  Impression & Plans:   This is a 31 year old female with one year of episodic sore throat, headache and nasal obstruction. Maxillofacial CT imaging was negative for nasal polyps or sinusitis. Adenoids are enlarged. Recommend in-office biopsy to rule out lymphoma. Reassured patient that vocal cords are normal on exam. She may be experiencing recurrent upper respiratory tract infections. Symptomatic care measures discussed for headache and sore throat including tylenol/Ibuprofen and warm salt water gargling. She will return to the office as scheduled for the nasopharyngeal biopsy.  Patient agrees with the plan.   Lilyan Gilford, MD  [phone]

## 2016-08-09 ENCOUNTER — Encounter (HOSPITAL_BASED_OUTPATIENT_CLINIC_OR_DEPARTMENT_OTHER)
Admission: RE | Disposition: A | Discharge status: Home / Self Care | Payer: Self-pay | Source: Ambulatory Visit | Attending: Otolaryngology

## 2016-08-09 ENCOUNTER — Ambulatory Visit: Payer: Self-pay | Admitting: Otolaryngology

## 2016-08-09 ENCOUNTER — Encounter (HOSPITAL_BASED_OUTPATIENT_CLINIC_OR_DEPARTMENT_OTHER): Payer: Self-pay | Admitting: Anesthesiology

## 2016-08-09 ENCOUNTER — Ambulatory Visit (HOSPITAL_BASED_OUTPATIENT_CLINIC_OR_DEPARTMENT_OTHER)
Admission: RE | Admit: 2016-08-09 | Discharge: 2016-08-09 | Disposition: A | Discharge status: Home / Self Care | Payer: 59 | Source: Ambulatory Visit | Attending: Otolaryngology | Admitting: Otolaryngology

## 2016-08-09 SURGERY — ADENOIDECTOMY
Anesthesia: General

## 2016-08-09 MED ORDER — SUCCINYLCHOLINE CHLORIDE 200 MG/10ML IV SOSY
PREFILLED_SYRINGE | INTRAVENOUS | Status: AC
  Filled 2016-08-09: qty 10

## 2016-08-09 MED ORDER — ONDANSETRON HCL 4 MG/2ML IJ SOLN
INTRAMUSCULAR | Status: AC
  Filled 2016-08-09: qty 2

## 2016-08-09 MED ORDER — PROPOFOL 10 MG/ML IV BOLUS
INTRAVENOUS | Status: AC
  Filled 2016-08-09: qty 20

## 2016-08-09 MED ORDER — LIDOCAINE 2% (20 MG/ML) 5 ML SYRINGE
INTRAMUSCULAR | Status: AC
  Filled 2016-08-09: qty 5

## 2016-08-09 MED ORDER — DEXAMETHASONE SODIUM PHOSPHATE 10 MG/ML IJ SOLN
INTRAMUSCULAR | Status: AC
  Filled 2016-08-09: qty 1

## 2016-08-09 NOTE — Interval H&P Note (Signed)
History and Physical Interval Note:  08/09/2016 9:30 AM  Christine Holland  has presented today for surgery, with the diagnosis of OBSTRUCTIVE ADENOID HYPERTROPHY  Pt with fever, cough, wheezing x 3 days.  Case is cancelled and pt referred to PCP for possible Influenza.  Discussed with pt and with Anesthesiologist. Jodi Marble

## 2016-08-09 NOTE — H&P (View-Only) (Signed)
Otolaryngology Clinic Note  HPI:      Chief Complaint  Patient presents with  . swollen throat/unable to talk/ dr bates pt    Christine Holland is a 31 y.o. female patient of Christine Honour, MD for evaluation of recurrent sore throat and headache. She was last seen by Dr. Redmond Holland 01/20/16. For the past year, she experiences episodic sore throat with headache, neck pain, hoarseness and nasal congestion. Symptoms are currently present for three days. Fever of 102; has dropped to about 100. She is taking Tylenol and throat lozenges. She does get heartburn on occasion and uses antacids. No recent antibiotics but she has been treated with multiple rounds this year. In June she had a negative chest x-ray. No head imaging. No history of recurrent sinusitis that she is aware of.   Non-smoker. No PMH of infectious diseases such as HIV. HIV testing negative recently.  From [place]; lived in the [place] since 2009.   PMH/Meds/All/SocHx/FamHx/ROS:   Past Medical History  History reviewed. No pertinent past medical history.    Past Surgical History  History reviewed. No pertinent surgical history.    No family history of bleeding disorders, wound healing problems or difficulty with anesthesia.   Social History  Social History        Social History  . Marital status: N/A    Spouse name: N/A  . Number of children: N/A  . Years of education: N/A      Occupational History  . Not on file.       Social History Main Topics  . Smoking status: Never Smoker  . Smokeless tobacco: Never Used  . Alcohol use Not on file  . Drug use: Unknown  . Sexual activity: Not on file       Other Topics Concern  . Not on file      Social History Narrative  . No narrative on file      No current outpatient prescriptions on file.  A complete ROS was performed with pertinent positives/negatives noted in the HPI. The remainder of the ROS are negative.    Physical Exam:    There  were no vitals taken for this visit.   General Awake, at baseline alertness during examination.  Voice Hoarse  Eyes No scleral icterus or conjunctival hemorrhage. Globe position appears normal. EOMI.   Right Ear EAC patent, TM intact w/o inflammation. Middle ear well aerated.   Left Ear EAC patent, TM intact w/o inflammation. Middle ear well aerated.   Nose Patent, no polyps or masses seen on anterior rhinoscopy.  Oral cavity No mucosal lesions or tumors seen. Tongue midline.   Oropharynx Symmetric tonsils, 1+. Oropharynx without erythema or exudate.   Larynx (indirect mirror exam) Vocal cords with good mobility; no mass, nodule or polyp. Base of tongue normal and symmetric.  Neck No abnormal cervical lymphadenopathy. No thyromegaly. No thyroid masses palpated.  Cardio-vascular No cyanosis.  Pulmonary No audible stridor. Breathing easily with no labor.  Neuro Symmetric facial movement.   Psychiatry Appropriate affect and mood for clinic visit.    Independent Review of Additional Tests or Records:  Medical records.  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 No active sinus disease.  __________________________________________  Flexible Laryngoscopy Procedure Note:  Date: 06/09/16  Indications: adenoid hypertrophy  Risks, benefits and clinical relevance of the study were discussed. The patient understands and agrees to proceed.   Procedure: After adequate topical anesthetic was applied, 4 mm flexible laryngoscope was  passed through the nasal cavity without difficulty. Flexible laryngoscopy shows patent anterior nasal cavity. There is yellow tinged mucopurulent discharge noted at the moderately sized adenoids.  Normal base of tongue and supraglottis Normal vocal cord mobility without vocal cord nodule, mass, polyp or tumor. Hypopharynx normal without mass, pooling of secretions or aspiration.  Patient tolerated procedure without complication or  difficulty.   Jolene Provost, PA-C Dr. Erik Obey, MD present for exam.  Impression & Plans:   This is a 31 year old female with one year of episodic sore throat, headache and nasal obstruction. Maxillofacial CT imaging was negative for nasal polyps or sinusitis. Adenoids are enlarged. Recommend in-office biopsy to rule out lymphoma. Reassured patient that vocal cords are normal on exam. She may be experiencing recurrent upper respiratory tract infections. Symptomatic care measures discussed for headache and sore throat including tylenol/Ibuprofen and warm salt water gargling. She will return to the office as scheduled for the nasopharyngeal biopsy.  Patient agrees with the plan.   Lilyan Gilford, MD  [phone]

## 2016-11-30 ENCOUNTER — Encounter: Payer: Self-pay | Admitting: Physician Assistant

## 2016-11-30 ENCOUNTER — Ambulatory Visit (INDEPENDENT_AMBULATORY_CARE_PROVIDER_SITE_OTHER): Payer: 59 | Admitting: Physician Assistant

## 2016-11-30 VITALS — BP 100/60 | HR 79 | Temp 98.4°F | Resp 16 | Ht 68.0 in | Wt 191.8 lb

## 2016-11-30 DIAGNOSIS — M25561 Pain in right knee: Secondary | ICD-10-CM | POA: Diagnosis not present

## 2016-11-30 DIAGNOSIS — S8391XA Sprain of unspecified site of right knee, initial encounter: Secondary | ICD-10-CM

## 2016-11-30 MED ORDER — MELOXICAM 15 MG PO TABS: 15.0000 mg | ORAL_TABLET | Freq: Every day | ORAL | 0 refills | Status: DC

## 2016-11-30 NOTE — Progress Notes (Signed)
PRIMARY CARE AT [place], [place] [address] [phone]  Date:  11/30/2016   Name:  Christine Holland   DOB:  [birth year]   MRN:  [id number]  PCP:  Wardell Honour, MD    History of Present Illness:  Christine Holland is a 31 y.o. female patient who presents to PCP with  Chief Complaint  Patient presents with  . Knee Injury    RIGHT  while playing tennis yesterday     Knee pain that started 1 week ago that was very mild.   Major pain, running and felt a painful area of the right knee that started yesterday.  She was running and pivoting weight from one side to the next.  She started placing an ice patch.  She woke up to the pain in the morning.  She could not bend the knee due to the pain.  She has not noticed knee knee swelling.  At the inside of the knee on the lateral area of the knee.  She has never injured the knee before.   Patient Active Problem List   Diagnosis Date Noted  . [place], BREAST 11/16/2009  . ANA POSITIVE 10/05/2009  . URINALYSIS, ABNORMAL 07/29/2009  . SICKLE-CELL TRAIT 07/06/2009  . OTHER NEUTROPENIA 07/06/2009  . BREAST TENDERNESS 06/10/2009  . BREAST MASS, RIGHT 06/10/2009    Past Medical History:  Diagnosis Date  . Anemia   . Bilateral fibroadenomas of breasts   . Leukocytopenia    s/p hematology consultation 2013.  . Sickle cell trait [facility])     Past Surgical History:  Procedure Laterality Date  . BREAST SURGERY      Social History  Substance Use Topics  . Smoking status: Never Smoker  . Smokeless tobacco: Never Used     Comment: NEVER USED TOBACCO  . Alcohol use No    Family History  Problem Relation Age of Onset  . Hypertension Mother   . Hypertension Father     No Known Allergies  Medication list has been reviewed and updated.  No current outpatient prescriptions on file prior to visit.   No current facility-administered medications on file prior to visit.     ROS ROS otherwise unremarkable unless listed  above.  Physical Examination: BP 100/60 (BP Location: Right Arm, Patient Position: Sitting, Cuff Size: Large)   Pulse 79   Temp 98.4 F (36.9 C) (Oral)   Resp 16   Ht 5\' 8"  (1.727 m)   Wt 191 lb 12.8 oz (87 kg)   LMP 11/10/2016   SpO2 99%   BMI 29.16 kg/m  Ideal Body Weight: Weight in (lb) to have BMI = 25: 164.1  Physical Exam  Constitutional: She is oriented to person, place, and time. She appears well-developed and well-nourished. No distress.  HENT:  Head: Normocephalic and atraumatic.  Right Ear: External ear normal.  Left Ear: External ear normal.  Eyes: Conjunctivae and EOM are normal. Pupils are equal, round, and reactive to light.  Cardiovascular: Normal rate.   Pulmonary/Chest: Effort normal. No respiratory distress.  Musculoskeletal:       Right knee: She exhibits normal range of motion, no LCL laxity, no bony tenderness, normal meniscus and no MCL laxity. Tenderness found. Lateral joint line tenderness noted. No patellar tendon tenderness noted.  Neurological: She is alert and oriented to person, place, and time.  Skin: She is not diaphoretic.  Psychiatric: She has a normal mood and affect. Her behavior is normal.  Assessment and Plan: Christine Holland is a 31 y.o. female who is here today for right knee pain. --advised nsaid use with precaution, brace, and icing three times per day --rtc in 2 weeks if no improvement of her symptoms.  Sprain of right knee, unspecified ligament, initial encounter - Plan: meloxicam (MOBIC) 15 MG tablet  Acute pain of right knee - Plan: meloxicam (MOBIC) 15 MG tablet  Ivar Drape, PA-C [facility] 6/24/20182:16 AM

## 2016-11-30 NOTE — Patient Instructions (Addendum)
I would like you to ice the knee three times per day for 15 minutes.  I would like you to start the stretches for the knee in 24-48 hours.  You will do a specific group (3) of stretches and/or strengthening three times per day.  Do not take ibuprofen or naproxen with the mobic.  You can take tylenol.   Knee Exercises Ask your health care provider which exercises are safe for you. Do exercises exactly as told by your health care provider and adjust them as directed. It is normal to feel mild stretching, pulling, tightness, or discomfort as you do these exercises, but you should stop right away if you feel sudden pain or your pain gets worse.Do not begin these exercises until told by your health care provider. STRETCHING AND RANGE OF MOTION EXERCISES These exercises warm up your muscles and joints and improve the movement and flexibility of your knee. These exercises also help to relieve pain, numbness, and tingling. Exercise A: Knee Extension, Prone 1. Lie on your abdomen on a bed. 2. Place your left / right knee just beyond the edge of the surface so your knee is not on the bed. You can put a towel under your left / right thigh just above your knee for comfort. 3. Relax your leg muscles and allow gravity to straighten your knee. You should feel a stretch behind your left / right knee. 4. Hold this position for __________ seconds. 5. Scoot up so your knee is supported between repetitions. Repeat __________ times. Complete this stretch __________ times a day. Exercise B: Knee Flexion, Active  1. Lie on your back with both knees straight. If this causes back discomfort, bend your left / right knee so your foot is flat on the floor. 2. Slowly slide your left / right heel back toward your buttocks until you feel a gentle stretch in the front of your knee or thigh. 3. Hold this position for __________ seconds. 4. Slowly slide your left / right heel back to the starting position. Repeat __________  times. Complete this exercise __________ times a day. Exercise C: Quadriceps, Prone  1. Lie on your abdomen on a firm surface, such as a bed or padded floor. 2. Bend your left / right knee and hold your ankle. If you cannot reach your ankle or pant leg, loop a belt around your foot and grab the belt instead. 3. Gently pull your heel toward your buttocks. Your knee should not slide out to the side. You should feel a stretch in the front of your thigh and knee. 4. Hold this position for __________ seconds. Repeat __________ times. Complete this stretch __________ times a day. Exercise D: Hamstring, Supine 1. Lie on your back. 2. Loop a belt or towel over the ball of your left / right foot. The ball of your foot is on the walking surface, right under your toes. 3. Straighten your left / right knee and slowly pull on the belt to raise your leg until you feel a gentle stretch behind your knee. ? Do not let your left / right knee bend while you do this. ? Keep your other leg flat on the floor. 4. Hold this position for __________ seconds. Repeat __________ times. Complete this stretch __________ times a day. STRENGTHENING EXERCISES These exercises build strength and endurance in your knee. Endurance is the ability to use your muscles for a long time, even after they get tired. Exercise E: Quadriceps, Isometric  1. Lie on your  back with your left / right leg extended and your other knee bent. Put a rolled towel or small pillow under your knee if told by your health care provider. 2. Slowly tense the muscles in the front of your left / right thigh. You should see your kneecap slide up toward your hip or see increased dimpling just above the knee. This motion will push the back of the knee toward the floor. 3. For __________ seconds, keep the muscle as tight as you can without increasing your pain. 4. Relax the muscles slowly and completely. Repeat __________ times. Complete this exercise __________  times a day. Exercise F: Straight Leg Raises - Quadriceps 1. Lie on your back with your left / right leg extended and your other knee bent. 2. Tense the muscles in the front of your left / right thigh. You should see your kneecap slide up or see increased dimpling just above the knee. Your thigh may even shake a bit. 3. Keep these muscles tight as you raise your leg 4-6 inches (10-15 cm) off the floor. Do not let your knee bend. 4. Hold this position for __________ seconds. 5. Keep these muscles tense as you lower your leg. 6. Relax your muscles slowly and completely after each repetition. Repeat __________ times. Complete this exercise __________ times a day. Exercise G: Hamstring, Isometric 1. Lie on your back on a firm surface. 2. Bend your left / right knee approximately __________ degrees. 3. Dig your left / right heel into the surface as if you are trying to pull it toward your buttocks. Tighten the muscles in the back of your thighs to dig as hard as you can without increasing any pain. 4. Hold this position for __________ seconds. 5. Release the tension gradually and allow your muscles to relax completely for __________ seconds after each repetition. Repeat __________ times. Complete this exercise __________ times a day. Exercise H: Hamstring Curls  If told by your health care provider, do this exercise while wearing ankle weights. Begin with __________ weights. Then increase the weight by 1 lb (0.5 kg) increments. Do not wear ankle weights that are more than __________. 1. Lie on your abdomen with your legs straight. 2. Bend your left / right knee as far as you can without feeling pain. Keep your hips flat against the floor. 3. Hold this position for __________ seconds. 4. Slowly lower your leg to the starting position.  Repeat __________ times. Complete this exercise __________ times a day. Exercise I: Squats (Quadriceps) 1. Stand in front of a table, with your feet and knees  pointing straight ahead. You may rest your hands on the table for balance but not for support. 2. Slowly bend your knees and lower your hips like you are going to sit in a chair. ? Keep your weight over your heels, not over your toes. ? Keep your lower legs upright so they are parallel with the table legs. ? Do not let your hips go lower than your knees. ? Do not bend lower than told by your health care provider. ? If your knee pain increases, do not bend as low. 3. Hold the squat position for __________ seconds. 4. Slowly push with your legs to return to standing. Do not use your hands to pull yourself to standing. Repeat __________ times. Complete this exercise __________ times a day. Exercise J: Wall Slides (Quadriceps)  1. Lean your back against a smooth wall or door while you walk your feet out 18-24 inches (  46-61 cm) from it. 2. Place your feet hip-width apart. 3. Slowly slide down the wall or door until your knees bend __________ degrees. Keep your knees over your heels, not over your toes. Keep your knees in line with your hips. 4. Hold for __________ seconds. Repeat __________ times. Complete this exercise __________ times a day. Exercise K: Straight Leg Raises - Hip Abductors 1. Lie on your side with your left / right leg in the top position. Lie so your head, shoulder, knee, and hip line up. You may bend your bottom knee to help you keep your balance. 2. Roll your hips slightly forward so your hips are stacked directly over each other and your left / right knee is facing forward. 3. Leading with your heel, lift your top leg 4-6 inches (10-15 cm). You should feel the muscles in your outer hip lifting. ? Do not let your foot drift forward. ? Do not let your knee roll toward the ceiling. 4. Hold this position for __________ seconds. 5. Slowly return your leg to the starting position. 6. Let your muscles relax completely after each repetition. Repeat __________ times. Complete this  exercise __________ times a day. Exercise L: Straight Leg Raises - Hip Extensors 1. Lie on your abdomen on a firm surface. You can put a pillow under your hips if that is more comfortable. 2. Tense the muscles in your buttocks and lift your left / right leg about 4-6 inches (10-15 cm). Keep your knee straight as you lift your leg. 3. Hold this position for __________ seconds. 4. Slowly lower your leg to the starting position. 5. Let your leg relax completely after each repetition. Repeat __________ times. Complete this exercise __________ times a day. This information is not intended to replace advice given to you by your health care provider. Make sure you discuss any questions you have with your health care provider. Document Released: 04/14/2005 Document Revised: 02/23/2016 Document Reviewed: 04/06/2015 Elsevier Interactive Patient Education  2018 [employer].     IF you received an x-ray today, you will receive an invoice from [facility]. Please contact [facility] at [phone] with questions or concerns regarding your invoice.   IF you received labwork today, you will receive an invoice from [facility]. Please contact [facility] at [phone] with questions or concerns regarding your invoice.   Our billing staff will not be able to assist you with questions regarding bills from these companies.  You will be contacted with the lab results as soon as they are available. The fastest way to get your results is to activate your My Chart account. Instructions are located on the last page of this paperwork. If you have not heard from [place] regarding the results in 2 weeks, please contact this office.

## 2017-09-21 ENCOUNTER — Encounter: Payer: Self-pay | Admitting: Physician Assistant

## 2018-01-18 ENCOUNTER — Ambulatory Visit (INDEPENDENT_AMBULATORY_CARE_PROVIDER_SITE_OTHER): Payer: 59 | Admitting: Physician Assistant

## 2018-01-18 ENCOUNTER — Other Ambulatory Visit: Payer: Self-pay

## 2018-01-18 ENCOUNTER — Encounter: Payer: Self-pay | Admitting: Physician Assistant

## 2018-01-18 VITALS — BP 120/80 | HR 80 | Temp 98.0°F | Resp 16 | Ht 68.0 in | Wt 180.8 lb

## 2018-01-18 DIAGNOSIS — Z13228 Encounter for screening for other metabolic disorders: Secondary | ICD-10-CM | POA: Diagnosis not present

## 2018-01-18 DIAGNOSIS — Z0000 Encounter for general adult medical examination without abnormal findings: Secondary | ICD-10-CM | POA: Diagnosis not present

## 2018-01-18 DIAGNOSIS — Z124 Encounter for screening for malignant neoplasm of cervix: Secondary | ICD-10-CM

## 2018-01-18 DIAGNOSIS — Z13220 Encounter for screening for lipoid disorders: Secondary | ICD-10-CM

## 2018-01-18 DIAGNOSIS — Z789 Other specified health status: Secondary | ICD-10-CM

## 2018-01-18 DIAGNOSIS — D241 Benign neoplasm of right breast: Secondary | ICD-10-CM

## 2018-01-18 DIAGNOSIS — D242 Benign neoplasm of left breast: Secondary | ICD-10-CM

## 2018-01-18 DIAGNOSIS — D573 Sickle-cell trait: Secondary | ICD-10-CM

## 2018-01-18 DIAGNOSIS — R7989 Other specified abnormal findings of blood chemistry: Secondary | ICD-10-CM

## 2018-01-18 DIAGNOSIS — Z1389 Encounter for screening for other disorder: Secondary | ICD-10-CM | POA: Diagnosis not present

## 2018-01-18 DIAGNOSIS — Z23 Encounter for immunization: Secondary | ICD-10-CM

## 2018-01-18 NOTE — Progress Notes (Signed)
Christine Holland  MRN: [id number] DOB: 04-26-1986  Subjective:  Pt is a 32 y.o. female who presents for annual physical exam. Pt is fasting today.   Diet: Has been doing keto diet for 36 days. Planning to do it for 60 days. Has lost 16 lbs since starting. Feeling more energetic at this time. Drinks water.  Exercise: [employer] 3 x week. Weights and cardio at the gym. 1.5 hrs typically.   Sleep: 6 hours.   BM: Every 2-3 days.   Menses: Occur monthly, maximum 2 days. Denies dysmenorrhea and menorrhagia.   Family planning: wants to have kids "whenever I get married." Not sexually active since 2005. Last    Last dental exam: 2018 Last vision exam: 2018, wears Rx eyeglasses. Did not wear them today.  Last pap smear: 2015  Vaccinations      Tetanus: 2009  Has hx of: -anemia, which is stable.  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 Notes they have not changed in size. Denies pain, overlying skin changes, nipple discharge. No FH of breast cancer.      Patient Active Problem List   Diagnosis Date Noted  . [place], BREAST 11/16/2009  . ANA POSITIVE 10/05/2009  . URINALYSIS, ABNORMAL 07/29/2009  . SICKLE-CELL TRAIT 07/06/2009  . OTHER NEUTROPENIA 07/06/2009  . BREAST TENDERNESS 06/10/2009  . BREAST MASS, RIGHT 06/10/2009    Current Outpatient Medications on File Prior to Visit  Medication Sig Dispense Refill  . meloxicam (MOBIC) 15 MG tablet Take 1 tablet (15 mg total) by mouth daily. (Patient not taking: Reported on 01/18/2018) 30 tablet 0   No current facility-administered medications on file prior to visit.     No Known Allergies  Social History   Socioeconomic History  . Marital status: Single    Spouse name: Not on file  . Number of children: 0  . Years of education: Not on file  . Highest education level: Bachelor's degree (e.g., BA, AB, BS)  Occupational History  . Occupation: Geophysical data processor  Social Needs  .  Financial resource strain: Not hard at all  . Food insecurity:    Worry: Never true    Inability: Never true  . Transportation needs:    Medical: No    Non-medical: No  Tobacco Use  . Smoking status: Never Smoker  . Smokeless tobacco: Never Used  . Tobacco comment: NEVER USED TOBACCO  Substance and Sexual Activity  . Alcohol use: No    Alcohol/week: 0.0 oz  . Drug use: No  . Sexual activity: Not Currently    Partners: Male  Lifestyle  . Physical activity:    Days per week: 3 days    Minutes per session: 90 min  . Stress: Not at all  Relationships  . Social connections:    Talks on phone: More than three times a week    Gets together: More than three times a week    Attends religious service: More than 4 times per year    Active member of club or organization: Yes    Attends meetings of clubs or organizations: More than 4 times per year    Relationship status: Never married  Other Topics Concern  . Not on file  Social History Narrative   Marital status: single;    From [place], [place]; [place] since 2009.      Children:  None      Lives: with parent/mom, brothers.  Employment:  Works for [employer] in [employer]; bilingual Economist.      Tobacco: none      Alcohol: none      Drugs: none      Exercise:  [employer]      Sexual activity:  None; last sexual activity 2005; total partners = 1; no STDs.      Past Surgical History:  Procedure Laterality Date  . BREAST SURGERY      Family History  Problem Relation Age of Onset  . Hypertension Mother   . Hypertension Father     Review of Systems  Constitutional: Negative for activity change, appetite change, chills, diaphoresis, fatigue, fever and unexpected weight change.  HENT: Negative for congestion, dental problem, drooling, ear discharge, ear pain, facial swelling, hearing loss, mouth sores, nosebleeds, postnasal drip, rhinorrhea, sinus pressure, sinus pain, sneezing, sore throat, tinnitus, trouble  swallowing and voice change.   Eyes: Negative for photophobia, pain, discharge, redness, itching and visual disturbance.  Respiratory: Negative for apnea, cough, choking, chest tightness, shortness of breath, wheezing and stridor.   Cardiovascular: Negative for chest pain, palpitations and leg swelling.  Gastrointestinal: Negative for abdominal distention, abdominal pain, anal bleeding, blood in stool, constipation, diarrhea, nausea, rectal pain and vomiting.  Endocrine: Negative for cold intolerance, heat intolerance, polydipsia, polyphagia and polyuria.  Genitourinary: Negative for decreased urine volume, difficulty urinating, dyspareunia, dysuria, enuresis, flank pain, frequency, genital sores, hematuria, menstrual problem, pelvic pain, urgency, vaginal bleeding, vaginal discharge and vaginal pain.  Musculoskeletal: Negative for arthralgias, back pain, gait problem, joint swelling, myalgias, neck pain and neck stiffness.  Skin: Negative for color change, pallor, rash and wound.  Allergic/Immunologic: Negative for environmental allergies, food allergies and immunocompromised state.  Neurological: Negative for dizziness, tremors, seizures, syncope, facial asymmetry, speech difficulty, weakness, light-headedness, numbness and headaches.  Hematological: Negative for adenopathy. Does not bruise/bleed easily.  Psychiatric/Behavioral: Negative for agitation, behavioral problems, confusion, decreased concentration, dysphoric mood, hallucinations, self-injury, sleep disturbance and suicidal ideas. The patient is not nervous/anxious and is not hyperactive.     Objective:  BP 120/80   Pulse 80   Temp 98 F (36.7 C)   Resp 16   Ht 5' 8" (1.727 m)   Wt 180 lb 12.8 oz (82 kg)   SpO2 99%   BMI 27.49 kg/m   Physical Exam  Constitutional: She is oriented to person, place, and time. She appears well-developed and well-nourished. No distress.  HENT:  Head: Normocephalic and atraumatic.  Right Ear:  Hearing, tympanic membrane, external ear and ear canal normal.  Left Ear: Hearing, tympanic membrane, external ear and ear canal normal.  Nose: Nose normal.  Mouth/Throat: Uvula is midline, oropharynx is clear and moist and mucous membranes are normal. No oropharyngeal exudate.  Eyes: Pupils are equal, round, and reactive to light. Conjunctivae, EOM and lids are normal. No scleral icterus.  Neck: Trachea normal and normal range of motion. No thyroid mass and no thyromegaly present.  Cardiovascular: Normal rate, regular rhythm, normal heart sounds and intact distal pulses.  Pulmonary/Chest: Effort normal and breath sounds normal. Right breast exhibits mass. Right breast exhibits no inverted nipple, no nipple discharge, no skin change and no tenderness. Left breast exhibits mass. Left breast exhibits no inverted nipple, no nipple discharge, no skin change and no tenderness.    Abdominal: Soft. Normal appearance and bowel sounds are normal. There is no tenderness.  Lymphadenopathy:       Head (right side): No tonsillar, no preauricular, no posterior  auricular and no occipital adenopathy present.       Head (left side): No tonsillar, no preauricular, no posterior auricular and no occipital adenopathy present.    She has no cervical adenopathy.       Right: No supraclavicular adenopathy present.       Left: No supraclavicular adenopathy present.  Neurological: She is alert and oriented to person, place, and time. She has normal strength and normal reflexes.  Skin: Skin is warm and dry.    Visual Acuity Screening   Right eye Left eye Both eyes  Without correction: 20/50 20/50 20/70  With correction:       Wt Readings from Last 3 Encounters:  01/18/18 180 lb 12.8 oz (82 kg)  11/30/16 191 lb 12.8 oz (87 kg)  08/02/16 176 lb (79.8 kg)     Assessment and Plan :  Discussed healthy lifestyle, diet, exercise, preventative care, vaccinations, and addressed patient's concerns. Plan for follow up  in on year. Otherwise, plan for specific conditions below.  1. Annual physical exam Await lab results.   2. Need for prophylactic vaccination with combined diphtheria-tetanus-pertussis (DTP) vaccine - Tdap vaccine greater than or equal to 7yo IM  3. Abnormal TSH - TSH  4. SICKLE-CELL TRAIT - CBC with Differential/Platelet  5. Screening cholesterol level  - Lipid panel  6. Screening for hematuria or proteinuria - Urinalysis, dipstick only  7. Screening for metabolic disorder - [id number]  8. Ketogenic diet - Lipid panel  9. Bilateral fibroadenomas of breasts Rec f/u US.  - US BREAST COMPLETE UNI LEFT INC AXILLA; Future - US BREAST COMPLETE UNI RIGHT INC AXILLA; Future  10. Screening for cervical cancer - Pap IG and HPV (high risk) DNA detection  Tenna Delaine, PA-C  [facility] 01/18/2018 10:06 AM

## 2018-01-18 NOTE — Patient Instructions (Addendum)
It was a pleasure meeting you today.  We should have your labs within one week. You should hear from breast center within 2 weeks. Follow up as needed.  Thank you for letting me participate in your health and well being. Health Maintenance, Female Adopting a healthy lifestyle and getting preventive care can go a long way to promote health and wellness. Talk with your health care provider about what schedule of regular examinations is right for you. This is a good chance for you to check in with your provider about disease prevention and staying healthy. In between checkups, there are plenty of things you can do on your own. Experts have done a lot of research about which lifestyle changes and preventive measures are most likely to keep you healthy. Ask your health care provider for more information. Weight and diet Eat a healthy diet  Be sure to include plenty of vegetables, fruits, low-fat dairy products, and lean protein.  Do not eat a lot of foods high in solid fats, added sugars, or salt.  Get regular exercise. This is one of the most important things you can do for your health. ? Most adults should exercise for at least 150 minutes each week. The exercise should increase your heart rate and make you sweat (moderate-intensity exercise). ? Most adults should also do strengthening exercises at least twice a week. This is in addition to the moderate-intensity exercise.  Maintain a healthy weight  Body mass index (BMI) is a measurement that can be used to identify possible weight problems. It estimates body fat based on height and weight. Your health care provider can help determine your BMI and help you achieve or maintain a healthy weight.  For females 67 years of age and older: ? A BMI below 18.5 is considered underweight. ? A BMI of 18.5 to 24.9 is normal. ? A BMI of 25 to 29.9 is considered overweight. ? A BMI of 30 and above is considered obese.  Watch levels of cholesterol and  blood lipids  You should start having your blood tested for lipids and cholesterol at 31 years of age, then have this test every 5 years.  You may need to have your cholesterol levels checked more often if: ? Your lipid or cholesterol levels are high. ? You are older than 31 years of age. ? You are at high risk for heart disease.  Cancer screening Lung Cancer  Lung cancer screening is recommended for adults 39-86 years old who are at high risk for lung cancer because of a history of smoking.  A yearly low-dose CT scan of the lungs is recommended for people who: ? Currently smoke. ? Have quit within the past 15 years. ? Have at least a 30-pack-year history of smoking. A pack year is smoking an average of one pack of cigarettes a day for 1 year.  Yearly screening should continue until it has been 15 years since you quit.  Yearly screening should stop if you develop a health problem that would prevent you from having lung cancer treatment.  Breast Cancer  Practice breast self-awareness. This means understanding how your breasts normally appear and feel.  It also means doing regular breast self-exams. Let your health care provider know about any changes, no matter how small.  If you are in your 20s or 30s, you should have a clinical breast exam (CBE) by a health care provider every 1-3 years as part of a regular health exam.  If you are 40  or older, have a CBE every year. Also consider having a breast X-ray (mammogram) every year.  If you have a family history of breast cancer, talk to your health care provider about genetic screening.  If you are at high risk for breast cancer, talk to your health care provider about having an MRI and a mammogram every year.  Breast cancer gene (BRCA) assessment is recommended for women who have family members with BRCA-related cancers. BRCA-related cancers include: ? Breast. ? Ovarian. ? Tubal. ? Peritoneal cancers.  Results of the assessment  will determine the need for genetic counseling and BRCA1 and BRCA2 testing.  Cervical Cancer Your health care provider may recommend that you be screened regularly for cancer of the pelvic organs (ovaries, uterus, and vagina). This screening involves a pelvic examination, including checking for microscopic changes to the surface of your cervix (Pap test). You may be encouraged to have this screening done every 3 years, beginning at age 1.  For women ages 109-65, health care providers may recommend pelvic exams and Pap testing every 3 years, or they may recommend the Pap and pelvic exam, combined with testing for human papilloma virus (HPV), every 5 years. Some types of HPV increase your risk of cervical cancer. Testing for HPV may also be done on women of any age with unclear Pap test results.  Other health care providers may not recommend any screening for nonpregnant women who are considered low risk for pelvic cancer and who do not have symptoms. Ask your health care provider if a screening pelvic exam is right for you.  If you have had past treatment for cervical cancer or a condition that could lead to cancer, you need Pap tests and screening for cancer for at least 20 years after your treatment. If Pap tests have been discontinued, your risk factors (such as having a new sexual partner) need to be reassessed to determine if screening should resume. Some women have medical problems that increase the chance of getting cervical cancer. In these cases, your health care provider may recommend more frequent screening and Pap tests.  Colorectal Cancer  This type of cancer can be detected and often prevented.  Routine colorectal cancer screening usually begins at 31 years of age and continues through 31 years of age.  Your health care provider may recommend screening at an earlier age if you have risk factors for colon cancer.  Your health care provider may also recommend using home test kits to  check for hidden blood in the stool.  A small camera at the end of a tube can be used to examine your colon directly (sigmoidoscopy or colonoscopy). This is done to check for the earliest forms of colorectal cancer.  Routine screening usually begins at age 26.  Direct examination of the colon should be repeated every 5-10 years through 31 years of age. However, you may need to be screened more often if early forms of precancerous polyps or small growths are found.  Skin Cancer  Check your skin from head to toe regularly.  Tell your health care provider about any new moles or changes in moles, especially if there is a change in a mole's shape or color.  Also tell your health care provider if you have a mole that is larger than the size of a pencil eraser.  Always use sunscreen. Apply sunscreen liberally and repeatedly throughout the day.  Protect yourself by wearing long sleeves, pants, a wide-brimmed hat, and sunglasses whenever you are  outside.  Heart disease, diabetes, and high blood pressure  High blood pressure causes heart disease and increases the risk of stroke. High blood pressure is more likely to develop in: ? People who have blood pressure in the high end of the normal range (130-139/85-89 mm Hg). ? People who are overweight or obese. ? People who are African American.  If you are 31-69 years of age, have your blood pressure checked every 3-5 years. If you are 42 years of age or older, have your blood pressure checked every year. You should have your blood pressure measured twice-once when you are at a hospital or clinic, and once when you are not at a hospital or clinic. Record the average of the two measurements. To check your blood pressure when you are not at a hospital or clinic, you can use: ? An automated blood pressure machine at a pharmacy. ? A home blood pressure monitor.  If you are between 32 years and 7 years old, ask your health care provider if you should  take aspirin to prevent strokes.  Have regular diabetes screenings. This involves taking a blood sample to check your fasting blood sugar level. ? If you are at a normal weight and have a low risk for diabetes, have this test once every three years after 31 years of age. ? If you are overweight and have a high risk for diabetes, consider being tested at a younger age or more often. Preventing infection Hepatitis B  If you have a higher risk for hepatitis B, you should be screened for this virus. You are considered at high risk for hepatitis B if: ? You were born in a country where hepatitis B is common. Ask your health care provider which countries are considered high risk. ? Your parents were born in a high-risk country, and you have not been immunized against hepatitis B (hepatitis B vaccine). ? You have HIV or AIDS. ? You use needles to inject street drugs. ? You live with someone who has hepatitis B. ? You have had sex with someone who has hepatitis B. ? You get hemodialysis treatment. ? You take certain medicines for conditions, including cancer, organ transplantation, and autoimmune conditions.  Hepatitis C  Blood testing is recommended for: ? Everyone born from [birth year] through 1964. ? Anyone with known risk factors for hepatitis C.  Sexually transmitted infections (STIs)  You should be screened for sexually transmitted infections (STIs) including gonorrhea and chlamydia if: ? You are sexually active and are younger than 31 years of age. ? You are older than 31 years of age and your health care provider tells you that you are at risk for this type of infection. ? Your sexual activity has changed since you were last screened and you are at an increased risk for chlamydia or gonorrhea. Ask your health care provider if you are at risk.  If you do not have HIV, but are at risk, it may be recommended that you take a prescription medicine daily to prevent HIV infection. This is called  pre-exposure prophylaxis (PrEP). You are considered at risk if: ? You are sexually active and do not regularly use condoms or know the HIV status of your partner(s). ? You take drugs by injection. ? You are sexually active with a partner who has HIV.  Talk with your health care provider about whether you are at high risk of being infected with HIV. If you choose to begin PrEP, you should first be tested  for HIV. You should then be tested every 3 months for as long as you are taking PrEP. Pregnancy  If you are premenopausal and you may become pregnant, ask your health care provider about preconception counseling.  If you may become pregnant, take 400 to 800 micrograms (mcg) of folic acid every day.  If you want to prevent pregnancy, talk to your health care provider about birth control (contraception). Osteoporosis and menopause  Osteoporosis is a disease in which the bones lose minerals and strength with aging. This can result in serious bone fractures. Your risk for osteoporosis can be identified using a bone density scan.  If you are 53 years of age or older, or if you are at risk for osteoporosis and fractures, ask your health care provider if you should be screened.  Ask your health care provider whether you should take a calcium or vitamin D supplement to lower your risk for osteoporosis.  Menopause may have certain physical symptoms and risks.  Hormone replacement therapy may reduce some of these symptoms and risks. Talk to your health care provider about whether hormone replacement therapy is right for you. Follow these instructions at home:  Schedule regular health, dental, and eye exams.  Stay current with your immunizations.  Do not use any tobacco products including cigarettes, chewing tobacco, or electronic cigarettes.  If you are pregnant, do not drink alcohol.  If you are breastfeeding, limit how much and how often you drink alcohol.  Limit alcohol intake to no more  than 1 drink per day for nonpregnant women. One drink equals 12 ounces of beer, 5 ounces of wine, or 1 ounces of hard liquor.  Do not use street drugs.  Do not share needles.  Ask your health care provider for help if you need support or information about quitting drugs.  Tell your health care provider if you often feel depressed.  Tell your health care provider if you have ever been abused or do not feel safe at home. This information is not intended to replace advice given to you by your health care provider. Make sure you discuss any questions you have with your health care provider. Document Released: 12/14/2010 Document Revised: 11/06/2015 Document Reviewed: 03/04/2015 Elsevier Interactive Patient Education  2018 [employer].    IF you received an x-ray today, you will receive an invoice from [facility]. Please contact [facility] at [phone] with questions or concerns regarding your invoice.   IF you received labwork today, you will receive an invoice from [facility]. Please contact [facility] at [phone] with questions or concerns regarding your invoice.   Our billing staff will not be able to assist you with questions regarding bills from these companies.  You will be contacted with the lab results as soon as they are available. The fastest way to get your results is to activate your My Chart account. Instructions are located on the last page of this paperwork. If you have not heard from [place] regarding the results in 2 weeks, please contact this office.

## 2018-01-19 LAB — CMP14+EGFR
ALT: 20 IU/L (ref 0–32)
AST: 22 IU/L (ref 0–40)
Albumin/Globulin Ratio: 1 — ABNORMAL LOW (ref 1.2–2.2)
Albumin: 4.2 g/dL (ref 3.5–5.5)
Alkaline Phosphatase: 50 IU/L (ref 39–117)
BILIRUBIN TOTAL: 0.6 mg/dL (ref 0.0–1.2)
BUN / CREAT RATIO: 16 (ref 9–23)
BUN: 13 mg/dL (ref 6–20)
CALCIUM: 9.3 mg/dL (ref 8.7–10.2)
CHLORIDE: 103 mmol/L (ref 96–106)
CO2: 22 mmol/L (ref 20–29)
CREATININE: 0.79 mg/dL (ref 0.57–1.00)
GFR calc non Af Amer: 100 mL/min/{1.73_m2} (ref >59)
GFR, EST AFRICAN AMERICAN: 115 mL/min/{1.73_m2} (ref >59)
GLUCOSE: 81 mg/dL (ref 65–99)
Globulin, Total: 4.2 g/dL (ref 1.5–4.5)
Potassium: 4.1 mmol/L (ref 3.5–5.2)
Sodium: 138 mmol/L (ref 134–144)
TOTAL PROTEIN: 8.4 g/dL (ref 6.0–8.5)

## 2018-01-19 LAB — CBC WITH DIFFERENTIAL/PLATELET
Basophils Absolute: 0 10*3/uL (ref 0.0–0.2)
Basos: 0 %
EOS (ABSOLUTE): 0 10*3/uL (ref 0.0–0.4)
EOS: 0 %
Hematocrit: 37.7 % (ref 34.0–46.6)
Hemoglobin: 12.4 g/dL (ref 11.1–15.9)
IMMATURE GRANS (ABS): 0 10*3/uL (ref 0.0–0.1)
IMMATURE GRANULOCYTES: 0 %
LYMPHS ABS: 1.3 10*3/uL (ref 0.7–3.1)
LYMPHS: 50 %
MCH: 26.5 pg — ABNORMAL LOW (ref 26.6–33.0)
MCHC: 32.9 g/dL (ref 31.5–35.7)
MCV: 81 fL (ref 79–97)
MONOCYTES: 13 %
Monocytes Absolute: 0.3 10*3/uL (ref 0.1–0.9)
Neutrophils Absolute: 1 10*3/uL — ABNORMAL LOW (ref 1.4–7.0)
Neutrophils: 37 %
Platelets: 135 10*3/uL — ABNORMAL LOW (ref 150–450)
RBC: 4.68 x10E6/uL (ref 3.77–5.28)
RDW: 14.6 % (ref 12.3–15.4)
WBC: 2.7 10*3/uL — AB (ref 3.4–10.8)

## 2018-01-19 LAB — URINALYSIS, DIPSTICK ONLY
Bilirubin, UA: NEGATIVE
Glucose, UA: NEGATIVE
KETONES UA: NEGATIVE
LEUKOCYTES UA: NEGATIVE
NITRITE UA: NEGATIVE
PROTEIN UA: NEGATIVE
RBC, UA: NEGATIVE
SPEC GRAV UA: 1.028 (ref 1.005–1.030)
Urobilinogen, Ur: 0.2 mg/dL (ref 0.2–1.0)
pH, UA: 6 (ref 5.0–7.5)

## 2018-01-19 LAB — LIPID PANEL
Chol/HDL Ratio: 3.8 ratio (ref 0.0–4.4)
Cholesterol, Total: 150 mg/dL (ref 100–199)
HDL: 40 mg/dL (ref >39)
LDL CALC: 100 mg/dL — AB (ref 0–99)
Triglycerides: 49 mg/dL (ref 0–149)
VLDL CHOLESTEROL CAL: 10 mg/dL (ref 5–40)

## 2018-01-19 LAB — TSH: TSH: 1.87 u[IU]/mL (ref 0.450–4.500)

## 2018-01-21 ENCOUNTER — Other Ambulatory Visit: Payer: Self-pay | Admitting: Physician Assistant

## 2018-01-21 DIAGNOSIS — D241 Benign neoplasm of right breast: Secondary | ICD-10-CM

## 2018-01-21 DIAGNOSIS — D242 Benign neoplasm of left breast: Secondary | ICD-10-CM

## 2018-01-21 LAB — PAP IG AND HPV HIGH-RISK
HPV, high-risk: NEGATIVE
PAP SMEAR COMMENT: 0

## 2018-02-03 ENCOUNTER — Telehealth: Payer: Self-pay | Admitting: Physician Assistant

## 2018-02-03 NOTE — Telephone Encounter (Signed)
Copied from [place] [id number]. Topic: Inquiry >> Feb 02, 2018  5:27 PM Oliver Pila B wrote: Reason for CRM: pt called back to have a nurse give more explanation of labs

## 2018-02-07 NOTE — Telephone Encounter (Signed)
Spoke with pt this morning about labs, she verbalized understanding and states she will come back into the office in 2 weeks to repeat labs.

## 2018-02-10 ENCOUNTER — Ambulatory Visit
Admission: RE | Admit: 2018-02-10 | Discharge: 2018-02-10 | Disposition: A | Discharge status: Home / Self Care | Payer: 59 | Source: Ambulatory Visit | Attending: Physician Assistant | Admitting: Physician Assistant

## 2018-02-10 ENCOUNTER — Other Ambulatory Visit: Payer: Self-pay | Admitting: Physician Assistant

## 2018-02-10 DIAGNOSIS — D242 Benign neoplasm of left breast: Secondary | ICD-10-CM

## 2018-02-10 DIAGNOSIS — N6313 Unspecified lump in the right breast, lower outer quadrant: Secondary | ICD-10-CM | POA: Diagnosis not present

## 2018-02-10 DIAGNOSIS — R922 Inconclusive mammogram: Secondary | ICD-10-CM | POA: Diagnosis not present

## 2018-02-10 DIAGNOSIS — D241 Benign neoplasm of right breast: Secondary | ICD-10-CM

## 2018-02-10 DIAGNOSIS — N6322 Unspecified lump in the left breast, upper inner quadrant: Secondary | ICD-10-CM | POA: Diagnosis not present

## 2018-06-15 ENCOUNTER — Encounter: Payer: Self-pay | Admitting: Emergency Medicine

## 2018-06-15 ENCOUNTER — Ambulatory Visit
Admission: EM | Admit: 2018-06-15 | Discharge: 2018-06-15 | Disposition: A | Discharge status: Home / Self Care | Payer: Managed Care, Other (non HMO) | Attending: Physician Assistant | Admitting: Physician Assistant

## 2018-06-15 DIAGNOSIS — J069 Acute upper respiratory infection, unspecified: Secondary | ICD-10-CM

## 2018-06-15 DIAGNOSIS — B373 Candidiasis of vulva and vagina: Secondary | ICD-10-CM | POA: Insufficient documentation

## 2018-06-15 DIAGNOSIS — B3731 Acute candidiasis of vulva and vagina: Secondary | ICD-10-CM

## 2018-06-15 MED ORDER — IBUPROFEN 600 MG PO TABS: 600.0000 mg | ORAL_TABLET | Freq: Four times a day (QID) | ORAL | 0 refills | Status: DC | PRN

## 2018-06-15 MED ORDER — BENZONATATE 100 MG PO CAPS: 100.0000 mg | ORAL_CAPSULE | Freq: Three times a day (TID) | ORAL | 0 refills | Status: DC

## 2018-06-15 MED ORDER — FLUCONAZOLE 150 MG PO TABS: 150.0000 mg | ORAL_TABLET | Freq: Every day | ORAL | 0 refills | Status: DC

## 2018-06-15 NOTE — ED Triage Notes (Signed)
Pt presents to [facility] for assessment of sore throat, cough, congestion, fever (103 on Monday), body aches, chills and fatigue since Monday of this week

## 2018-06-15 NOTE — ED Notes (Signed)
Patient able to ambulate independently  

## 2018-06-15 NOTE — ED Provider Notes (Signed)
[facility]    CSN: [id number] Arrival date & time: 06/15/18  1416     History   Chief Complaint Chief Complaint  Patient presents with  . Flu-Like Symptoms    HPI Christine Holland is a 33 y.o. female.   The history is provided by the patient. No language interpreter was used.  Cough  Cough characteristics:  Productive Sputum characteristics:  Nondescript Severity:  Moderate Onset quality:  Gradual Timing:  Constant Progression:  Worsening Chronicity:  New Relieved by:  Nothing Worsened by:  Nothing Ineffective treatments:  None tried Associated symptoms: no fever   Risk factors: no recent infection   Pt reports she has had a cough for the past week.  Pt reports her throat is sore and she has lost her voice.    Past Medical History:  Diagnosis Date  . Anemia   . Bilateral fibroadenomas of breasts   . Leukocytopenia    s/p hematology consultation 2013.  . Sickle cell trait [facility])     Patient Active Problem List   Diagnosis Date Noted  . [place], BREAST 11/16/2009  . ANA POSITIVE 10/05/2009  . URINALYSIS, ABNORMAL 07/29/2009  . SICKLE-CELL TRAIT 07/06/2009  . OTHER NEUTROPENIA 07/06/2009  . BREAST TENDERNESS 06/10/2009  . BREAST MASS, RIGHT 06/10/2009    Past Surgical History:  Procedure Laterality Date  . BREAST CYST EXCISION Bilateral 2006  . BREAST SURGERY      OB History   No obstetric history on file.      Home Medications    Prior to Admission medications   Medication Sig Start Date End Date Taking? Authorizing Provider  benzonatate (TESSALON) 100 MG capsule Take 1 capsule (100 mg total) by mouth every 8 (eight) hours. 06/15/18   Fransico Meadow, PA-C  fluconazole (DIFLUCAN) 150 MG tablet Take 1 tablet (150 mg total) by mouth daily. 06/15/18   Fransico Meadow, PA-C  ibuprofen (ADVIL,MOTRIN) 600 MG tablet Take 1 tablet (600 mg total) by mouth every 6 (six) hours as needed. 06/15/18   Fransico Meadow, PA-C  meloxicam (MOBIC) 15 MG  tablet Take 1 tablet (15 mg total) by mouth daily. Patient not taking: Reported on 01/18/2018 11/30/16   Joretta Bachelor, PA    Family History Family History  Problem Relation Age of Onset  . Hypertension Mother   . Hypertension Father     Social History Social History   Tobacco Use  . Smoking status: Never Smoker  . Smokeless tobacco: Never Used  . Tobacco comment: NEVER USED TOBACCO  Substance Use Topics  . Alcohol use: No    Alcohol/week: 0.0 standard drinks  . Drug use: No     Allergies   Patient has no known allergies.   Review of Systems Review of Systems  Constitutional: Negative for fever.  Respiratory: Positive for cough.   All other systems reviewed and are negative.    Physical Exam Triage Vital Signs ED Triage Vitals [06/15/18 1436]  Enc Vitals Group     BP 116/75     Pulse Rate 83     Resp 18     Temp (!) 97.5 F (36.4 C)     Temp Source Oral     SpO2 97 %     Weight      Height      Head Circumference      Peak Flow      Pain Score 6     Pain Loc  Pain Edu?      Excl. in [place]?    No data found.  Updated Vital Signs BP 116/75 (BP Location: Left Arm)   Pulse 83   Temp (!) 97.5 F (36.4 C) (Oral)   Resp 18   LMP 05/28/2018   SpO2 97%   Visual Acuity Right Eye Distance:   Left Eye Distance:   Bilateral Distance:    Right Eye Near:   Left Eye Near:    Bilateral Near:     Physical Exam Constitutional:      Appearance: She is well-developed.  HENT:     Head: Normocephalic and atraumatic.     Right Ear: Tympanic membrane normal.     Left Ear: Tympanic membrane normal.     Mouth/Throat:     Mouth: Mucous membranes are moist.     Pharynx: Posterior oropharyngeal erythema present.  Eyes:     Conjunctiva/sclera: Conjunctivae normal.     Pupils: Pupils are equal, round, and reactive to light.  Neck:     Musculoskeletal: Normal range of motion and neck supple.  Cardiovascular:     Rate and Rhythm: Normal rate.    Pulmonary:     Effort: Pulmonary effort is normal.  Abdominal:     Palpations: Abdomen is soft.  Genitourinary:    General: Normal vulva.     Comments: Thin white discharge  Musculoskeletal: Normal range of motion.  Skin:    General: Skin is warm and dry.  Neurological:     Mental Status: She is alert and oriented to person, place, and time.  Psychiatric:        Mood and Affect: Mood normal.      UC Treatments / Results  Labs (all labs ordered are listed, but only abnormal results are displayed) Labs Reviewed - No data to display  EKG None  Radiology No results found.  Procedures Procedures (including critical care time)  Medications Ordered in UC Medications - No data to display  Initial Impression / Assessment and Plan / UC Course  I have reviewed the triage vital signs and the nursing notes.  Pertinent labs & imaging results that were available during my care of the patient were reviewed by me and considered in my medical decision making (see chart for details).      Final Clinical Impressions(s) / UC Diagnoses   Final diagnoses:  Acute upper respiratory infection  Yeast vaginitis     Discharge Instructions     Return if any problems.    ED Prescriptions    Medication Sig Dispense Auth. Provider   ibuprofen (ADVIL,MOTRIN) 600 MG tablet Take 1 tablet (600 mg total) by mouth every 6 (six) hours as needed. 30 tablet ,  K, [place]   fluconazole (DIFLUCAN) 150 MG tablet Take 1 tablet (150 mg total) by mouth daily. 1 tablet ,  K, PA-C   benzonatate (TESSALON) 100 MG capsule Take 1 capsule (100 mg total) by mouth every 8 (eight) hours. 21 capsule Fransico Meadow, [place]     Controlled Substance Prescriptions [place] Controlled Substance Registry consulted? Not Applicable  An After Visit Summary was printed and given to the patient.    Fransico Meadow, [place] 06/15/18 Vernelle Emerald

## 2018-06-15 NOTE — Discharge Instructions (Addendum)
Return if any problems.

## 2018-06-21 ENCOUNTER — Encounter: Payer: Self-pay | Admitting: Physician Assistant

## 2018-06-21 ENCOUNTER — Other Ambulatory Visit: Payer: Self-pay

## 2018-06-21 ENCOUNTER — Ambulatory Visit: Payer: Managed Care, Other (non HMO) | Admitting: Physician Assistant

## 2018-06-21 VITALS — BP 128/68 | HR 82 | Temp 98.8°F | Resp 16 | Ht 69.5 in | Wt 186.6 lb

## 2018-06-21 DIAGNOSIS — J029 Acute pharyngitis, unspecified: Secondary | ICD-10-CM

## 2018-06-21 DIAGNOSIS — R0981 Nasal congestion: Secondary | ICD-10-CM | POA: Diagnosis not present

## 2018-06-21 MED ORDER — FLUTICASONE PROPIONATE 50 MCG/ACT NA SUSP: 2.0000 | Freq: Every day | NASAL | 12 refills | Status: DC

## 2018-06-21 NOTE — Patient Instructions (Addendum)
Use a humidifier in your bedroom  Flonase nasal spray - 2 sprays each nostril daily.  Cepacol throat lozenges (buy this at your pharmacy)  Stay well hydrated. Get lost of rest. Wash your hands often.   -Foods that can help speed recovery: honey, garlic, chicken soup, elderberries, green tea.  -Supplements that can help speed recovery: vitamin C, zinc, elderberry extract, quercetin, ginseng, selenium -Supplement with prebiotics and probiotics:   Advil or ibuprofen for pain. Do not take Aspirin.  Drink enough water and fluids to keep your urine clear or pale yellow.  For sore throat: ? Gargle with 8 oz of salt water ( tsp of salt per 1 qt of water) as often as every 1-2 hours to soothe your throat.  Gargle liquid benadryl.    For sore throat try using a honey-based tea. Use 3 teaspoons of honey with juice squeezed from half lemon. Place shaved pieces of ginger into 1/2-1 cup of water and warm over stove top. Then mix the ingredients and repeat every 4 hours as needed.  Cough Syrup Recipe: Sweet Lemon & Honey Thyme  Ingredients . a handful of fresh thyme sprigs   . 1 pint of water (2 cups)  . 1/2 cup honey (raw is best, but regular will do)  . 1/2 lemon chopped Instructions 1. Place the lemon in the pint jar and cover with the honey. The honey will macerate the lemons and draw out liquids which taste so delicious! 2. Meanwhile, toss the thyme leaves into a saucepan and cover them with the water. 3. Bring the water to a gentle simmer and reduce it to half, about a cup of tea. 4. When the tea is reduced and cooled a bit, strain the sprigs & leaves, add it into the pint jar and stir it well. 5. Give it a shake and use a spoonful as needed. 6. Store your homemade cough syrup in the refrigerator for about a month.  What causes a cough?  In adults, common causes of a cough include: ?An infection of the airways or lungs (such as the common cold) ?Postnasal drip - Postnasal drip is when  mucus from the nose drips down or flows along the back of the throat. Postnasal drip can happen when people have: .A cold .Allergies .A sinus infection - The sinuses are hollow areas in the bones of the face that open into the nose. ?Lung conditions, like asthma and chronic obstructive pulmonary disease (COPD) - Both of these conditions can make it hard to breathe. COPD is usually caused by smoking. ?Acid reflux - Acid reflux is when the acid that is normally in your stomach backs up into your esophagus (the tube that carries food from your mouth to your stomach). ?A side effect from blood pressure medicines called "ACE inhibitors" ?Smoking cigarettes  Come back if you are not improving in 5-7 days.   Thank you for coming in today. I hope you feel we met your needs.  Feel free to call PCP if you have any questions or further requests.  Please consider signing up for MyChart if you do not already have it, as this is a great way to communicate with me.  Best,  Whitney McVey, PA-C    IF you received an x-ray today, you will receive an invoice from [facility]. Please contact [facility] at [phone] with questions or concerns regarding your invoice.   IF you received labwork today, you will receive an invoice from [facility]. Please contact [facility]  at [phone] with questions or concerns regarding your invoice.   Our billing staff will not be able to assist you with questions regarding bills from these companies.  You will be contacted with the lab results as soon as they are available. The fastest way to get your results is to activate your My Chart account. Instructions are located on the last page of this paperwork. If you have not heard from [place] regarding the results in 2 weeks, please contact this office.

## 2018-06-21 NOTE — Progress Notes (Signed)
Christine Holland  MRN: [id number] DOB: 08/02/1985  PCP: Patient, No Pcp Per  Subjective:  Pt is 33 year old female who presents to clinic for sore throat. Had a viral infection last week and felt better. Two days ago sore throat. She cannot swallow. Pain comes and goes. Lasts for about 1-2 hours.  Sore throat lozenges and tea  Review of Systems  Constitutional: Negative for chills, diaphoresis, fatigue and fever.  HENT: Positive for sore throat. Negative for congestion, postnasal drip, rhinorrhea, sinus pressure and sinus pain.   Respiratory: Negative for cough, shortness of breath and wheezing.   Psychiatric/Behavioral: Negative for sleep disturbance.    Patient Active Problem List   Diagnosis Date Noted  . [place], BREAST 11/16/2009  . ANA POSITIVE 10/05/2009  . URINALYSIS, ABNORMAL 07/29/2009  . SICKLE-CELL TRAIT 07/06/2009  . OTHER NEUTROPENIA 07/06/2009  . BREAST TENDERNESS 06/10/2009  . BREAST MASS, RIGHT 06/10/2009    Current Outpatient Medications on File Prior to Visit  Medication Sig Dispense Refill  . benzonatate (TESSALON) 100 MG capsule Take 1 capsule (100 mg total) by mouth every 8 (eight) hours. 21 capsule 0  . ibuprofen (ADVIL,MOTRIN) 600 MG tablet Take 1 tablet (600 mg total) by mouth every 6 (six) hours as needed. 30 tablet 0  . meloxicam (MOBIC) 15 MG tablet Take 1 tablet (15 mg total) by mouth daily. (Patient not taking: Reported on 01/18/2018) 30 tablet 0   No current facility-administered medications on file prior to visit.     No Known Allergies   Objective:  BP 128/68 (BP Location: Right Arm, Patient Position: Sitting, Cuff Size: Normal)   Pulse 82   Temp 98.8 F (37.1 C) (Oral)   Resp 16   Ht 5' 9.5" (1.765 m)   Wt 186 lb 9.6 oz (84.6 kg)   LMP 05/28/2018   SpO2 100%   BMI 27.16 kg/m   Physical Exam Vitals signs reviewed.  Constitutional:      General: She is not in acute distress. HENT:     Right Ear: Tympanic membrane normal.   Left Ear: Tympanic membrane normal.     Nose: Mucosal edema present. No rhinorrhea.     Right Sinus: No maxillary sinus tenderness or frontal sinus tenderness.     Left Sinus: No maxillary sinus tenderness or frontal sinus tenderness.  Pulmonary:     Effort: Pulmonary effort is normal. No respiratory distress.     Breath sounds: Normal breath sounds. No wheezing or rales.  Skin:    General: Skin is warm and dry.  Neurological:     Mental Status: She is alert and oriented to person, place, and time.  Psychiatric:        Judgment: Judgment normal.     Assessment and Plan :  1. Nasal congestion 2. Sore throat -Patient endorses intermittent sore throat for the past several days.  HPI and physical exam do not indicate throat.  Suspect sore throat secondary to postnasal drip and nasal congestion.  Plan to treat supportively with Flonase and hydration.  Return to clinic in 5 days if no improvement - fluticasone (FLONASE) 50 MCG/ACT nasal spray; Place 2 sprays into both nostrils daily.  Dispense: 16 g; Refill: 12   Whitney Farouk Vivero, PA-C  [facility] at [facility] 06/21/2018 9:42 AM  Please note: Portions of this report may have been transcribed using dragon voice recognition software. Every effort was made to ensure accuracy; however, inadvertent computerized transcription errors may be  present.

## 2018-08-15 ENCOUNTER — Other Ambulatory Visit: Payer: 59

## 2018-09-08 ENCOUNTER — Ambulatory Visit
Admission: RE | Admit: 2018-09-08 | Discharge: 2018-09-08 | Disposition: A | Discharge status: Home / Self Care | Payer: Managed Care, Other (non HMO) | Source: Ambulatory Visit | Attending: Physician Assistant | Admitting: Physician Assistant

## 2018-09-08 ENCOUNTER — Other Ambulatory Visit: Payer: Self-pay | Admitting: Physician Assistant

## 2018-09-08 ENCOUNTER — Other Ambulatory Visit: Payer: Self-pay

## 2018-09-08 DIAGNOSIS — D241 Benign neoplasm of right breast: Secondary | ICD-10-CM

## 2018-09-08 DIAGNOSIS — D242 Benign neoplasm of left breast: Secondary | ICD-10-CM

## 2019-01-19 ENCOUNTER — Telehealth (INDEPENDENT_AMBULATORY_CARE_PROVIDER_SITE_OTHER): Payer: Managed Care, Other (non HMO) | Admitting: Family Medicine

## 2019-01-19 ENCOUNTER — Encounter: Payer: Self-pay | Admitting: Family Medicine

## 2019-01-19 ENCOUNTER — Other Ambulatory Visit: Payer: Self-pay

## 2019-01-19 ENCOUNTER — Other Ambulatory Visit: Payer: Self-pay | Admitting: *Deleted

## 2019-01-19 VITALS — Ht 68.0 in | Wt 196.0 lb

## 2019-01-19 DIAGNOSIS — R6889 Other general symptoms and signs: Secondary | ICD-10-CM | POA: Diagnosis not present

## 2019-01-19 DIAGNOSIS — Z20822 Contact with and (suspected) exposure to covid-19: Secondary | ICD-10-CM

## 2019-01-19 NOTE — Progress Notes (Signed)
   Virtual Visit Note  I connected with patient on 01/19/19 at 1017am by phone and verified that I am speaking with the correct person using two identifiers. Christine Holland is currently located at home and patient is currently with them during visit. The provider, Rutherford Guys, MD is located in their office at time of visit.  I discussed the limitations, risks, security and privacy concerns of performing an evaluation and management service by telephone and the availability of in person appointments. I also discussed with the patient that there may be a patient responsible charge related to this service. The patient expressed understanding and agreed to proceed.   CC: covid sx  HPI ? 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 Co-worker started having sx on Saturday. She was wearing her mask.  Patient started having sore throat,runny nose and body aches 3 days ago No fever, chills, cough, SOB, headaches, sneezing, loss of taste or smell, nausea, vomiting, diarrhea.  Her sister with also sick, having sore throat and SOB, getting worse  Patient has self quarantined.   No Known Allergies  Prior to Admission medications   Not on File    Past Medical History:  Diagnosis Date  . Anemia   . Bilateral fibroadenomas of breasts   . Leukocytopenia    s/p hematology consultation 2013.  . Sickle cell trait [facility])     Past Surgical History:  Procedure Laterality Date  . BREAST CYST EXCISION Bilateral 2006  . BREAST SURGERY      Social History   Tobacco Use  . Smoking status: Never Smoker  . Smokeless tobacco: Never Used  . Tobacco comment: NEVER USED TOBACCO  Substance Use Topics  . Alcohol use: No    Alcohol/week: 0.0 standard drinks    Family History  Problem Relation Age of Onset  . Hypertension Mother   . Hypertension Father      ROS Per hpi  Objective  Vitals as reported by the patient: none  AAOx3 Speaking in full sentences without difficulties  ASSESSMENT and PLAN  1. Suspected Covid-19 Virus Infection - Novel Coronavirus, NAA ([facility])  Patient evaluated via phone, sent for testing, given instructions for home care and Quarantine. Instructed to seek further care if symptoms worsen.    FOLLOW-UP: 1 week   The above assessment and management plan was discussed with the patient. The patient verbalized understanding of and has agreed to the management plan. Patient is aware to call the clinic if symptoms persist or worsen. Patient is aware when to return to the clinic for a follow-up visit. Patient educated on when it is appropriate to go to the emergency department.    I provided 11 minutes of non-face-to-face time during this encounter.  Rutherford Guys, MD Primary Care at [place], [place] [address] Ph.  [phone] Fax [phone]

## 2019-01-19 NOTE — Patient Instructions (Addendum)
  [place] [place], [place], [place] ([address])  M-F from 8am - 3:30pm   If you have lab work done today you will be contacted with your lab results within the next 2 weeks.  If you have not heard from [place] then please contact us. The fastest way to get your results is to register for My Chart.   IF you received an x-ray today, you will receive an invoice from [facility]. Please contact [facility] at [phone] with questions or concerns regarding your invoice.   IF you received labwork today, you will receive an invoice from [facility]. Please contact [facility] at [phone] with questions or concerns regarding your invoice.   Our billing staff will not be able to assist you with questions regarding bills from these companies.  You will be contacted with the lab results as soon as they are available. The fastest way to get your results is to activate your My Chart account. Instructions are located on the last page of this paperwork. If you have not heard from [place] regarding the results in 2 weeks, please contact this office.

## 2019-01-19 NOTE — Progress Notes (Signed)
Around someone who was COVID pos and wants to get tested.   Body aches and Soar throat started tuesday.

## 2019-01-20 LAB — NOVEL CORONAVIRUS, NAA: SARS-CoV-2, NAA: NOT DETECTED

## 2019-01-23 NOTE — Progress Notes (Signed)
Scheduled

## 2019-01-26 ENCOUNTER — Telehealth (INDEPENDENT_AMBULATORY_CARE_PROVIDER_SITE_OTHER): Payer: Managed Care, Other (non HMO) | Admitting: Family Medicine

## 2019-01-26 ENCOUNTER — Other Ambulatory Visit: Payer: Self-pay

## 2019-01-26 DIAGNOSIS — B349 Viral infection, unspecified: Secondary | ICD-10-CM | POA: Diagnosis not present

## 2019-01-26 NOTE — Progress Notes (Signed)
CC:  1 week f/u covid.  Per pt she is feeling ok and has no concerns for the provider.  No travel outside the [place] or [place] in the past 3 weeks.  No recent weight or bp taken.

## 2019-01-26 NOTE — Progress Notes (Signed)
   Virtual Visit Note  I connected with patient on 01/26/19 at 604pm by phone and verified that I am speaking with the correct person using two identifiers. Christine Holland is currently located at home and patient is currently with them during visit. The provider, Rutherford Guys, MD is located in their office at time of visit.  I discussed the limitations, risks, security and privacy concerns of performing an evaluation and management service by telephone and the availability of in person appointments. I also discussed with the patient that there may be a patient responsible charge related to this service. The patient expressed understanding and agreed to proceed.   CC: f/u on covid like sx  HPI ? Telemedicine visit a week ago Sx started 10 days ago covid test neg 01/19/2019 She has been staying at home since OV Reports all sx have resolved 4 days ago Never had fever Has been working from home   No Known Allergies  Prior to Admission medications   Not on File    Past Medical History:  Diagnosis Date  . Anemia   . Bilateral fibroadenomas of breasts   . Leukocytopenia    s/p hematology consultation 2013.  . Sickle cell trait [facility])     Past Surgical History:  Procedure Laterality Date  . BREAST CYST EXCISION Bilateral 2006  . BREAST SURGERY      Social History   Tobacco Use  . Smoking status: Never Smoker  . Smokeless tobacco: Never Used  . Tobacco comment: NEVER USED TOBACCO  Substance Use Topics  . Alcohol use: No    Alcohol/week: 0.0 standard drinks    Family History  Problem Relation Age of Onset  . Hypertension Mother   . Hypertension Father     ROS Per hpi  Objective  Vitals as reported by the patient: none   ASSESSMENT and PLAN  1. Covid-19 Virus not Detected All sx resolved, onset of sx 10 days ago, neg covid test. Patient meets criteria for d/c self isolation.  FOLLOW-UP: prn   The above assessment and management plan was discussed with the  patient. The patient verbalized understanding of and has agreed to the management plan. Patient is aware to call the clinic if symptoms persist or worsen. Patient is aware when to return to the clinic for a follow-up visit. Patient educated on when it is appropriate to go to the emergency department.    I provided 6 minutes of non-face-to-face time during this encounter.  Rutherford Guys, MD Primary Care at [place], [place] [address] Ph.  [phone] Fax [phone]

## 2019-01-26 NOTE — Patient Instructions (Signed)
° ° ° °  If you have lab work done today you will be contacted with your lab results within the next 2 weeks.  If you have not heard from us then please contact us. The fastest way to get your results is to register for My Chart. ° ° °IF you received an x-ray today, you will receive an invoice from [facility]. Please contact [facility] at [phone] with questions or concerns regarding your invoice.  ° °IF you received labwork today, you will receive an invoice from [facility]. Please contact [facility] at [phone] with questions or concerns regarding your invoice.  ° °Our billing staff will not be able to assist you with questions regarding bills from these companies. ° °You will be contacted with the lab results as soon as they are available. The fastest way to get your results is to activate your My Chart account. Instructions are located on the last page of this paperwork. If you have not heard from us regarding the results in 2 weeks, please contact this office. °  ° ° ° °

## 2019-02-13 ENCOUNTER — Ambulatory Visit: Payer: Managed Care, Other (non HMO) | Admitting: Family Medicine

## 2019-02-13 ENCOUNTER — Other Ambulatory Visit: Payer: Self-pay

## 2019-02-13 ENCOUNTER — Ambulatory Visit (INDEPENDENT_AMBULATORY_CARE_PROVIDER_SITE_OTHER): Payer: Managed Care, Other (non HMO)

## 2019-02-13 ENCOUNTER — Encounter: Payer: Self-pay | Admitting: Family Medicine

## 2019-02-13 VITALS — BP 113/68 | HR 89 | Temp 98.3°F | Ht 68.0 in | Wt 200.0 lb

## 2019-02-13 DIAGNOSIS — M25562 Pain in left knee: Secondary | ICD-10-CM

## 2019-02-13 DIAGNOSIS — M549 Dorsalgia, unspecified: Secondary | ICD-10-CM

## 2019-02-13 DIAGNOSIS — G8929 Other chronic pain: Secondary | ICD-10-CM

## 2019-02-13 MED ORDER — NAPROXEN 500 MG PO TABS: 500.0000 mg | ORAL_TABLET | Freq: Two times a day (BID) | ORAL | 0 refills | Status: DC | PRN

## 2019-02-13 NOTE — Progress Notes (Signed)
9/1/20203:35 PM  AMBREE HERSON 08-Jan-1986, 33 y.o., female [id number]  Chief Complaint  Patient presents with  . Pain    pain on the right side of the back for 3 wks    HPI:   Patient is a 33 y.o. female who presents today for right upper back pain  2-3 weeks of right upper back pain only with movement Does not radiates No pain with deep breathing, no cough, no fever or chills Has not taken anything for this Denies any inciting events  Left knee pain since fall in feb 2020 when she slipped on black ice Pain is intermittent, worse when she tries to bend her knee such as sitting cross legged Last week she felt her knee was weak No swelling, no popping or clicking, no locking  Depression screen [facility] 2/9 02/13/2019 01/26/2019 06/21/2018  Decreased Interest 0 0 0  Down, Depressed, Hopeless 0 0 0  PHQ - 2 Score 0 0 0    Fall Risk  02/13/2019 01/26/2019 06/21/2018 01/18/2018 11/30/2016  Falls in the past year? 1 0 0 No No  Number falls in past yr: 0 0 - - -  Injury with Fall? 1 0 - - -  Comment has left knee pain - - - -  Follow up - Falls evaluation completed - - -     No Known Allergies  Prior to Admission medications   Not on File    Past Medical History:  Diagnosis Date  . Anemia   . Bilateral fibroadenomas of breasts   . Leukocytopenia    s/p hematology consultation 2013.  . Sickle cell trait [facility])     Past Surgical History:  Procedure Laterality Date  . BREAST CYST EXCISION Bilateral 2006  . BREAST SURGERY      Social History   Tobacco Use  . Smoking status: Never Smoker  . Smokeless tobacco: Never Used  . Tobacco comment: NEVER USED TOBACCO  Substance Use Topics  . Alcohol use: No    Alcohol/week: 0.0 standard drinks    Family History  Problem Relation Age of Onset  . Hypertension Mother   . Hypertension Father     ROS Per hpi  OBJECTIVE:  Today's Vitals   02/13/19 1523  BP: 113/68  Pulse: 89  Temp: 98.3 F (36.8 C)  SpO2: 100%  Weight: 200  lb (90.7 kg)  Height: 5\' 8"  (1.727 m)   Body mass index is 30.41 kg/m.    Physical Exam Vitals signs and nursing note reviewed.  Constitutional:      Appearance: She is well-developed.  HENT:     Head: Normocephalic and atraumatic.     Mouth/Throat:     Pharynx: No oropharyngeal exudate.  Eyes:     General: No scleral icterus.    Conjunctiva/sclera: Conjunctivae normal.     Pupils: Pupils are equal, round, and reactive to light.  Neck:     Musculoskeletal: Neck supple.  Cardiovascular:     Rate and Rhythm: Normal rate and regular rhythm.     Heart sounds: Normal heart sounds. No murmur. No friction rub. No gallop.   Pulmonary:     Effort: Pulmonary effort is normal.     Breath sounds: Normal breath sounds. No wheezing or rales.  Musculoskeletal:     Right knee: Normal.     Left knee: She exhibits normal range of motion (with mild pain at end range of flexion), no swelling, no LCL laxity, normal meniscus and no MCL laxity.  No tenderness found.     Thoracic back: Normal. She exhibits normal range of motion, no tenderness, no swelling and no spasm.  Skin:    General: Skin is warm and dry.  Neurological:     Mental Status: She is alert and oriented to person, place, and time.     No results found for this or any previous visit (from the past 24 hour(s)).  Dg Knee 1-2 Views Left  Result Date: 02/13/2019 CLINICAL DATA:  pain with flexion and weakness after fall several months ago EXAM: LEFT KNEE - 1-2 VIEW COMPARISON:  None. FINDINGS: No evidence of fracture, dislocation, or joint effusion. No evidence of arthropathy or other focal bone abnormality. Soft tissues are unremarkable. IMPRESSION: No acute osseous abnormality in the left knee. Electronically Signed   By: Audie Pinto M.D.   On: 02/13/2019 16:03     ASSESSMENT and PLAN  1. Chronic pain of left knee - DG Knee 1-2 Views Left; Future  2. Upper back pain on right side  Normal exam and knee xray. Trial of  naproxen, gentle stretching and strengthening exercises at home. If she would like to see sport medicine for her knee given episode of weakness, she can call for referral.   Other orders - naproxen (NAPROSYN) 500 MG tablet; Take 1 tablet (500 mg total) by mouth 2 (two) times daily as needed for moderate pain (take with food).  Return if symptoms worsen or fail to improve.    Rutherford Guys, MD [facility] [place], [place] [address] Ph.  [phone] Fax [phone]

## 2019-02-13 NOTE — Patient Instructions (Signed)
° ° ° °  If you have lab work done today you will be contacted with your lab results within the next 2 weeks.  If you have not heard from us then please contact us. The fastest way to get your results is to register for My Chart. ° ° °IF you received an x-ray today, you will receive an invoice from [facility]. Please contact [facility] at [phone] with questions or concerns regarding your invoice.  ° °IF you received labwork today, you will receive an invoice from [facility]. Please contact [facility] at [phone] with questions or concerns regarding your invoice.  ° °Our billing staff will not be able to assist you with questions regarding bills from these companies. ° °You will be contacted with the lab results as soon as they are available. The fastest way to get your results is to activate your My Chart account. Instructions are located on the last page of this paperwork. If you have not heard from us regarding the results in 2 weeks, please contact this office. °  ° ° ° °

## 2019-03-16 ENCOUNTER — Other Ambulatory Visit: Payer: Managed Care, Other (non HMO)

## 2019-12-04 ENCOUNTER — Other Ambulatory Visit: Payer: Self-pay

## 2019-12-04 ENCOUNTER — Ambulatory Visit (INDEPENDENT_AMBULATORY_CARE_PROVIDER_SITE_OTHER): Payer: 59 | Admitting: Emergency Medicine

## 2019-12-04 DIAGNOSIS — Z0000 Encounter for general adult medical examination without abnormal findings: Secondary | ICD-10-CM

## 2019-12-05 LAB — LIPID PANEL
Chol/HDL Ratio: 4.3 ratio (ref 0.0–4.4)
Cholesterol, Total: 149 mg/dL (ref 100–199)
HDL: 35 mg/dL — ABNORMAL LOW (ref >39)
LDL Chol Calc (NIH): 98 mg/dL (ref 0–99)
Triglycerides: 81 mg/dL (ref 0–149)
VLDL Cholesterol Cal: 16 mg/dL (ref 5–40)

## 2019-12-05 LAB — CBC WITH DIFFERENTIAL/PLATELET
Basophils Absolute: 0 10*3/uL (ref 0.0–0.2)
Basos: 0 %
EOS (ABSOLUTE): 0 10*3/uL (ref 0.0–0.4)
Eos: 0 %
Hematocrit: 34 % (ref 34.0–46.6)
Hemoglobin: 11.4 g/dL (ref 11.1–15.9)
Immature Grans (Abs): 0 10*3/uL (ref 0.0–0.1)
Immature Granulocytes: 0 %
Lymphocytes Absolute: 1.7 10*3/uL (ref 0.7–3.1)
Lymphs: 45 %
MCH: 26.2 pg — ABNORMAL LOW (ref 26.6–33.0)
MCHC: 33.5 g/dL (ref 31.5–35.7)
MCV: 78 fL — ABNORMAL LOW (ref 79–97)
Monocytes Absolute: 0.5 10*3/uL (ref 0.1–0.9)
Monocytes: 13 %
Neutrophils Absolute: 1.6 10*3/uL (ref 1.4–7.0)
Neutrophils: 42 %
Platelets: 144 10*3/uL — ABNORMAL LOW (ref 150–450)
RBC: 4.35 x10E6/uL (ref 3.77–5.28)
RDW: 14.1 % (ref 11.7–15.4)
WBC: 3.8 10*3/uL (ref 3.4–10.8)

## 2019-12-05 LAB — CMP14+EGFR
ALT: 12 IU/L (ref 0–32)
AST: 17 IU/L (ref 0–40)
Albumin/Globulin Ratio: 1 — ABNORMAL LOW (ref 1.2–2.2)
Albumin: 3.8 g/dL (ref 3.8–4.8)
Alkaline Phosphatase: 64 IU/L (ref 48–121)
BUN/Creatinine Ratio: 9 (ref 9–23)
BUN: 7 mg/dL (ref 6–20)
Bilirubin Total: 0.3 mg/dL (ref 0.0–1.2)
CO2: 19 mmol/L — ABNORMAL LOW (ref 20–29)
Calcium: 8.5 mg/dL — ABNORMAL LOW (ref 8.7–10.2)
Chloride: 105 mmol/L (ref 96–106)
Creatinine, Ser: 0.81 mg/dL (ref 0.57–1.00)
GFR calc Af Amer: 110 mL/min/{1.73_m2} (ref >59)
GFR calc non Af Amer: 96 mL/min/{1.73_m2} (ref >59)
Globulin, Total: 3.8 g/dL (ref 1.5–4.5)
Glucose: 91 mg/dL (ref 65–99)
Potassium: 3.8 mmol/L (ref 3.5–5.2)
Sodium: 137 mmol/L (ref 134–144)
Total Protein: 7.6 g/dL (ref 6.0–8.5)

## 2019-12-05 LAB — TSH: TSH: 1.74 u[IU]/mL (ref 0.450–4.500)

## 2019-12-07 ENCOUNTER — Encounter: Payer: Self-pay | Admitting: Family Medicine

## 2019-12-09 NOTE — Addendum Note (Signed)
Addended by: Rutherford Guys on: 12/09/2019 01:27 PM   Modules accepted: Orders

## 2019-12-10 ENCOUNTER — Encounter: Payer: Self-pay | Admitting: Family Medicine

## 2019-12-14 ENCOUNTER — Encounter: Payer: Self-pay | Admitting: Family Medicine

## 2019-12-14 ENCOUNTER — Other Ambulatory Visit: Payer: Self-pay

## 2019-12-14 ENCOUNTER — Ambulatory Visit (INDEPENDENT_AMBULATORY_CARE_PROVIDER_SITE_OTHER): Payer: 59 | Admitting: Family Medicine

## 2019-12-14 VITALS — BP 122/77 | HR 80 | Temp 97.6°F | Ht 68.5 in | Wt 202.0 lb

## 2019-12-14 DIAGNOSIS — Z0001 Encounter for general adult medical examination with abnormal findings: Secondary | ICD-10-CM

## 2019-12-14 DIAGNOSIS — Z0000 Encounter for general adult medical examination without abnormal findings: Secondary | ICD-10-CM

## 2019-12-14 DIAGNOSIS — Z298 Encounter for other specified prophylactic measures: Secondary | ICD-10-CM | POA: Diagnosis not present

## 2019-12-14 DIAGNOSIS — Z7184 Encounter for health counseling related to travel: Secondary | ICD-10-CM | POA: Diagnosis not present

## 2019-12-14 MED ORDER — CIPROFLOXACIN HCL 500 MG PO TABS: 500.0000 mg | ORAL_TABLET | Freq: Two times a day (BID) | ORAL | 0 refills | Status: DC

## 2019-12-14 MED ORDER — MEFLOQUINE HCL 250 MG PO TABS: ORAL_TABLET | ORAL | 0 refills | Status: DC

## 2019-12-14 NOTE — Patient Instructions (Addendum)
  Your labs were overall good and I see nothing to be concerned about.  Your thyroid test was good.  I would urge you to continue to try and get regular exercise.  Keep using the T for your bowels.  If necessary you can also try MiraLAX for a mild laxative.  You can try Metamucil or Citrucel if needed for a fiber additive to improve the moisture content of your stools.  Always drink plenty of water.  Always try to do regular walking.  When you go to the [place] I recommend you take malaria prophylaxis with: Mefloquine 250 mg 1 pill weekly beginning 2 weeks before travel to the [place] and continuing for 4 weeks after return.  Also carry with you some ciprofloxacin 500 mg.  If you develop traveler's diarrhea type I pill immediately and 1 twice daily for about 3 days.  Look up the CDC website for Travelers to [facility] specifics.  Return as needed or in 1 year  If you have lab work done today you will be contacted with your lab results within the next 2 weeks.  If you have not heard from [place] then please contact us. The fastest way to get your results is to register for My Chart.  My grandfather's autobiography is at :[email or web address]  IF you received an x-ray today, you will receive an invoice from [facility]. Please contact [facility] at [phone] with questions or concerns regarding your invoice.   IF you received labwork today, you will receive an invoice from [place]. Please contact [facility] at [phone] with questions or concerns regarding your invoice.   Our billing staff will not be able to assist you with questions regarding bills from these companies.  You will be contacted with the lab results as soon as they are available. The fastest way to get your results is to activate your My Chart account. Instructions are located on the last page of this paperwork. If you have not heard from [place] regarding the results in 2 weeks, please contact  this office.

## 2019-12-14 NOTE — Progress Notes (Signed)
Patient ID: Christine Holland, female    DOB: Jan 18, 1986  Age: 34 y.o. MRN: [id number]  Chief Complaint  Patient presents with  . Annual Exam    Subjective:  Christine Holland is a 34 year old lady here for annual physical examination.  She is healthy with no major complaints.  Someone at church told her she needed to get her thyroid checked.  She had blood work done a couple of weeks ago.  Past medical history: Operations: Breast biopsies, benign Medical illnesses: None Allergies: None Medications: None Gravida 0 para 0  Family history: Parents are living and well.  Father is in the [place], mother in the [place].  No major diseases.   Social history: She was born and raised in the [place], has lived here I believe 9 years.  She is getting her masters and she works for [employer].  She gets some regular exercise.  She is not sexually involved.  She is actively involved in a church.  She does not smoke or drink or use drugs.  Review of systems Constitutional: Unremarkable HEENT: Normal Cardiovascular: Unremarkable GI: Unremarkable GU: Unremarkable Musculoskeletal: Unremarkable Dermatologic: Unremarkable Neurologic: Unremarkable Psychiatric: Unremarkable Endocrine: Someone told her to get her thyroid checked     Current allergies, medications, problem list, past/family and social histories reviewed.  Objective:  BP 122/77   Pulse 80   Temp 97.6 F (36.4 C) (Temporal)   Ht 5' 8.5" (1.74 m)   Wt 202 lb (91.6 kg)   LMP 12/02/2019   SpO2 95%   BMI 30.27 kg/m   Well-developed well-nourished young lady in no acute distress.  TMs normal.  Eyes PERRL.  Fundi benign.  Throat clear.  Teeth good.  Neck supple without nodes or thyromegaly.  No carotid bruits.  Chest clear to auscultation.  Heart rate without murmurs, gallops, or arrhythmias.  Abdomen soft without mass or tenderness.  Genitalia pelvic exam not done as she has had it done elsewhere.  Her breast exam was not done because she has that  followed elsewhere.  Extremities unremarkable.  Skin unremarkable.  Assessment & Plan:   Assessment: 1. Annual physical exam   2. Need for malaria prophylaxis   3. Travel advice encounter   4. Hypocalcemia       Plan: See instructions.  She is traveling to the [employer] next month so I gave her some prophylactic medications.  No orders of the defined types were placed in this encounter.   Meds ordered this encounter  Medications  . ciprofloxacin (CIPRO) 500 MG tablet    Sig: Take 1 tablet (500 mg total) by mouth 2 (two) times daily.    Dispense:  10 tablet    Refill:  0  . mefloquine (LARIAM) 250 MG tablet    Sig: Take 1 weekly beginning 2 weeks prior to travel and continuing 4 weeks after return.    Dispense:  10 tablet    Refill:  0         Patient Instructions    Your labs were overall good and I see nothing to be concerned about.  Your thyroid test was good.  I would urge you to continue to try and get regular exercise.  Keep using the T for your bowels.  If necessary you can also try MiraLAX for a mild laxative.  You can try Metamucil or Citrucel if needed for a fiber additive to improve the moisture content of your stools.  Always drink plenty of water.  Always try to do  regular walking.  When you go to the [place] I recommend you take malaria prophylaxis with: Mefloquine 250 mg 1 pill weekly beginning 2 weeks before travel to the [place] and continuing for 4 weeks after return.  Also carry with you some ciprofloxacin 500 mg.  If you develop traveler's diarrhea type I pill immediately and 1 twice daily for about 3 days.  Look up the CDC website for Travelers to [facility] specifics.  Return as needed or in 1 year  If you have lab work done today you will be contacted with your lab results within the next 2 weeks.  If you have not heard from [place] then please contact us. The fastest way to get your results is to register for My Chart.  My grandfather's autobiography is at  :[email or web address]  IF you received an x-ray today, you will receive an invoice from [facility]. Please contact [facility] at [phone] with questions or concerns regarding your invoice.   IF you received labwork today, you will receive an invoice from [place]. Please contact [facility] at [phone] with questions or concerns regarding your invoice.   Our billing staff will not be able to assist you with questions regarding bills from these companies.  You will be contacted with the lab results as soon as they are available. The fastest way to get your results is to activate your My Chart account. Instructions are located on the last page of this paperwork. If you have not heard from [place] regarding the results in 2 weeks, please contact this office.        Return in about 1 year (around 12/13/2020).   Ruben Reason, MD 12/14/2019

## 2019-12-15 LAB — CALCIUM, IONIZED: Calcium, Ion: 4.9 mg/dL (ref 4.5–5.6)

## 2020-01-03 ENCOUNTER — Telehealth: Payer: Self-pay | Admitting: Family Medicine

## 2020-01-03 ENCOUNTER — Other Ambulatory Visit: Payer: Self-pay | Admitting: Family Medicine

## 2020-01-03 MED ORDER — DOXYCYCLINE HYCLATE 100 MG PO TABS
100.0000 mg | ORAL_TABLET | Freq: Every day | ORAL | 0 refills | Status: DC
Start: 2020-01-03 — End: 2022-04-05

## 2020-01-03 NOTE — Telephone Encounter (Signed)
[facility] is calling regarding mefloquine (LARIAM) 250 MG tablet [id number]. Medication is no longer on the market. Can a new script be sent please? CB- [phone]

## 2020-01-03 NOTE — Telephone Encounter (Signed)
Pt saw Linna Darner 12/14/2019 pt was prescribed Mefloquine 250 mg which is no longer available pt needs something else to be prescribed to her

## 2020-01-03 NOTE — Telephone Encounter (Signed)
Please let her know that I sent in a prescription for doxycycline, thanks

## 2020-01-04 NOTE — Telephone Encounter (Signed)
Patient was informed.

## 2020-05-14 ENCOUNTER — Emergency Department (HOSPITAL_COMMUNITY): Payer: 59

## 2020-05-14 ENCOUNTER — Encounter (HOSPITAL_COMMUNITY): Payer: Self-pay | Admitting: *Deleted

## 2020-05-14 ENCOUNTER — Other Ambulatory Visit: Payer: Self-pay

## 2020-05-14 ENCOUNTER — Ambulatory Visit (HOSPITAL_COMMUNITY)
Admission: EM | Admit: 2020-05-14 | Discharge: 2020-05-14 | Disposition: A | Discharge status: Home / Self Care | Payer: 59

## 2020-05-14 ENCOUNTER — Emergency Department (HOSPITAL_COMMUNITY)
Admission: EM | Admit: 2020-05-14 | Discharge: 2020-05-14 | Disposition: A | Discharge status: Home / Self Care | Payer: 59 | Attending: Emergency Medicine | Admitting: Emergency Medicine

## 2020-05-14 DIAGNOSIS — H5712 Ocular pain, left eye: Secondary | ICD-10-CM | POA: Diagnosis not present

## 2020-05-14 DIAGNOSIS — H01004 Unspecified blepharitis left upper eyelid: Secondary | ICD-10-CM | POA: Insufficient documentation

## 2020-05-14 DIAGNOSIS — R519 Headache, unspecified: Secondary | ICD-10-CM | POA: Diagnosis present

## 2020-05-14 LAB — I-STAT CHEM 8, ED
BUN: 9 mg/dL (ref 6–20)
Calcium, Ion: 1.15 mmol/L (ref 1.15–1.40)
Chloride: 103 mmol/L (ref 98–111)
Creatinine, Ser: 0.7 mg/dL (ref 0.44–1.00)
Glucose, Bld: 82 mg/dL (ref 70–99)
HCT: 38 % (ref 36.0–46.0)
Hemoglobin: 12.9 g/dL (ref 12.0–15.0)
Potassium: 3.8 mmol/L (ref 3.5–5.1)
Sodium: 139 mmol/L (ref 135–145)
TCO2: 24 mmol/L (ref 22–32)

## 2020-05-14 LAB — I-STAT BETA HCG BLOOD, ED (MC, WL, AP ONLY): I-stat hCG, quantitative: <5 m[IU]/mL (ref <5)

## 2020-05-14 MED ORDER — SODIUM CHLORIDE 0.9 % IV BOLUS
1000.0000 mL | Freq: Once | INTRAVENOUS | Status: AC
  Administered 2020-05-14: 1000 mL via INTRAVENOUS

## 2020-05-14 MED ORDER — FLUTICASONE PROPIONATE 50 MCG/ACT NA SUSP: 2.0000 | Freq: Every day | NASAL | 0 refills | Status: DC

## 2020-05-14 MED ORDER — METOCLOPRAMIDE HCL 5 MG/ML IJ SOLN
10.0000 mg | Freq: Once | INTRAMUSCULAR | Status: AC
  Administered 2020-05-14: 10 mg via INTRAVENOUS
  Filled 2020-05-14: qty 2

## 2020-05-14 MED ORDER — ERYTHROMYCIN 5 MG/GM OP OINT: TOPICAL_OINTMENT | OPHTHALMIC | 0 refills | Status: DC

## 2020-05-14 MED ORDER — DIPHENHYDRAMINE HCL 50 MG/ML IJ SOLN
25.0000 mg | Freq: Once | INTRAMUSCULAR | Status: AC
  Administered 2020-05-14: 25 mg via INTRAVENOUS
  Filled 2020-05-14: qty 1

## 2020-05-14 MED ORDER — ACETAMINOPHEN 325 MG PO TABS
650.0000 mg | ORAL_TABLET | Freq: Once | ORAL | Status: AC
  Administered 2020-05-14: 650 mg via ORAL
  Filled 2020-05-14: qty 2

## 2020-05-14 MED ORDER — IOHEXOL 300 MG/ML  SOLN
75.0000 mL | Freq: Once | INTRAMUSCULAR | Status: AC | PRN
  Administered 2020-05-14: 75 mL via INTRAVENOUS

## 2020-05-14 NOTE — ED Triage Notes (Signed)
Pt sent to [place] from [facility] to r/o orbital cellulitis , drainage coming from left eye and area is slightly swollen

## 2020-05-14 NOTE — ED Provider Notes (Signed)
____________________________________________  Time seen: Approximately 10:07 AM  I have reviewed the triage vital signs and the nursing notes.   HISTORY  Chief Complaint Eye Problem (LT)   Historian Patient   HPI Christine Holland is a 34 y.o. female presents to the urgent care with left eye pain and swelling.  Patient states that she has an 8 out of 10 headache and her symptoms initially started with acute onset of headache.  She states that it is now difficult for her to move her left eye and when she does she experiences pain.  She has had chills at home but is uncertain of fever.  She also states that she has had some chest pain and diarrhea.  She denies experiencing similar symptoms in the past.  No changes in vision.  No prior history of orbital or periorbital cellulitis in the past.  No other alleviating measures have been attempted   Past Medical History:  Diagnosis Date  . Anemia   . Bilateral fibroadenomas of breasts   . Leukocytopenia    s/p hematology consultation 2013.  . Sickle cell trait ([place])      Immunizations up to date:  Yes.     Past Medical History:  Diagnosis Date  . Anemia   . Bilateral fibroadenomas of breasts   . Leukocytopenia    s/p hematology consultation 2013.  . Sickle cell trait [facility])     Patient Active Problem List   Diagnosis Date Noted  . [employer], BREAST 11/16/2009  . ANA POSITIVE 10/05/2009  . URINALYSIS, ABNORMAL 07/29/2009  . SICKLE-CELL TRAIT 07/06/2009  . OTHER NEUTROPENIA 07/06/2009  . BREAST TENDERNESS 06/10/2009  . BREAST MASS, RIGHT 06/10/2009    Past Surgical History:  Procedure Laterality Date  . BREAST CYST EXCISION Bilateral 2006  . BREAST SURGERY      Prior to Admission medications   Medication Sig Start Date End Date Taking? Authorizing Provider  ciprofloxacin (CIPRO) 500 MG tablet Take 1 tablet (500 mg total) by mouth 2 (two) times daily. 12/14/19   Posey Boyer, MD  doxycycline (VIBRA-TABS) 100  MG tablet Take 1 tablet (100 mg total) by mouth daily. start two days before traveling and continue taking daily for 28 days after returning to [place]. 01/03/20   Rutherford Guys, MD    Allergies Patient has no known allergies.  Family History  Problem Relation Age of Onset  . Hypertension Mother   . Hypertension Father     Social History Social History   Tobacco Use  . Smoking status: Never Smoker  . Smokeless tobacco: Never Used  . Tobacco comment: NEVER USED TOBACCO  Vaping Use  . Vaping Use: Never used  Substance Use Topics  . Alcohol use: No    Alcohol/week: 0.0 standard drinks  . Drug use: No     Review of Systems  Constitutional: No fever/chills Eyes: Patient has left eye pain.  ENT: No upper respiratory complaints. Respiratory: no cough. No SOB/ use of accessory muscles to breath Gastrointestinal:   No nausea, no vomiting.  No diarrhea.  No constipation. Musculoskeletal: Negative for musculoskeletal pain. Skin: Negative for rash, abrasions, lacerations, ecchymosis.   ____________________________________________   PHYSICAL EXAM:  VITAL SIGNS: ED Triage Vitals  Enc Vitals Group     BP 05/14/20 0939 126/76     Pulse Rate 05/14/20 0939 80     Resp 05/14/20 0939 18     Temp 05/14/20 0939 97.9 F (36.6 C)     Temp Source  05/14/20 0939 Oral     SpO2 05/14/20 0939 100 %     Weight 05/14/20 0942 214 lb (97.1 kg)     Height 05/14/20 0942 5\' 8"  (1.727 m)     Head Circumference --      Peak Flow --      Pain Score 05/14/20 0940 8     Pain Loc --      Pain Edu? --      Excl. in [place]? --      Constitutional: Alert and oriented. Well appearing and in no acute distress. Eyes: Conjunctivae are normal. PERRL. EOMI. patient has erythema and edema of the left eyelid.  Patient is unable to open left eye.  Head: Atraumatic. ENT:      Nose: No congestion/rhinnorhea.      Mouth/Throat: Mucous membranes are moist.   Cardiovascular: Normal rate, regular rhythm. Normal  S1 and S2.  Good peripheral circulation. Respiratory: Normal respiratory effort without tachypnea or retractions. Lungs CTAB. Good air entry to the bases with no decreased or absent breath sounds Musculoskeletal: Full range of motion to all extremities. No obvious deformities noted Neurologic:  Normal for age. No gross focal neurologic deficits are appreciated.  Skin:  Skin is warm, dry and intact. No rash noted. Psychiatric: Mood and affect are normal for age. Speech and behavior are normal.   ____________________________________________   LABS (all labs ordered are listed, but only abnormal results are displayed)  Labs Reviewed - No data to display ____________________________________________  EKG   ____________________________________________  RADIOLOGY  No results found.  ____________________________________________    PROCEDURES  Procedure(s) performed:     Procedures     Medications - No data to display   ____________________________________________   INITIAL IMPRESSION / ASSESSMENT AND PLAN / ED COURSE  Pertinent labs & imaging results that were available during my care of the patient were reviewed by me and considered in my medical decision making (see chart for details).      Assessment and plan Eye pain 33 year old female presents to the urgent care with left-sided periorbital edema and erythema as well as extraocular eye muscle pain and headache.  Patient was referred to the emergency department for further evaluation as I am concerned for orbital cellulitis.  Patient stated that she would go to emergency department.  All patient questions were answered.   ____________________________________________  FINAL CLINICAL IMPRESSION(S) / ED DIAGNOSES  Final diagnoses:  Pain of left eye      NEW MEDICATIONS STARTED DURING THIS VISIT:  ED Discharge Orders    None          This chart was dictated using voice recognition software/Dragon.  Despite best efforts to proofread, errors can occur which can change the meaning. Any change was purely unintentional.     Lannie Fields, PA-C 05/14/20 1012

## 2020-05-14 NOTE — ED Provider Notes (Signed)
[facility] EMERGENCY DEPARTMENT Provider Note   CSN: [id number] Arrival date & time: 05/14/20  1015     History No chief complaint on file.   Christine Holland is a 34 y.o. female.  HPI Patient is a 34 year old female with past medical history significant for anemia, sickle cell trait--I confirmed that she does not have sickle cell disease.  Patient presented today with headache.  She states that it is left-sided and she has associated left eye pain and swelling.  She states that the headache is 8/10.  She states that her headache started yesterday and seems to have progressively worsened.  She states that it hurts when she moves her eyes.  She denies any blurry vision double vision or other visual symptoms.  She states that she has recently had a sinus infection and this seems to be improving however her eye pain and headache seem to be worsening.  She denies any fevers, chills, nausea or vomiting.  No other associated symptoms.  No aggravating or mitigating factors apart from as discussed above.    Past Medical History:  Diagnosis Date  . Anemia   . Bilateral fibroadenomas of breasts   . Leukocytopenia    s/p hematology consultation 2013.  . Sickle cell trait [facility])     Patient Active Problem List   Diagnosis Date Noted  . [place], BREAST 11/16/2009  . ANA POSITIVE 10/05/2009  . URINALYSIS, ABNORMAL 07/29/2009  . SICKLE-CELL TRAIT 07/06/2009  . OTHER NEUTROPENIA 07/06/2009  . BREAST TENDERNESS 06/10/2009  . BREAST MASS, RIGHT 06/10/2009    Past Surgical History:  Procedure Laterality Date  . BREAST CYST EXCISION Bilateral 2006  . BREAST SURGERY       OB History   No obstetric history on file.     Family History  Problem Relation Age of Onset  . Hypertension Mother   . Hypertension Father     Social History   Tobacco Use  . Smoking status: Never Smoker  . Smokeless tobacco: Never Used  . Tobacco comment: NEVER USED TOBACCO    Vaping Use  . Vaping Use: Never used  Substance Use Topics  . Alcohol use: No    Alcohol/week: 0.0 standard drinks  . Drug use: No    Home Medications Prior to Admission medications   Medication Sig Start Date End Date Taking? Authorizing Provider  ciprofloxacin (CIPRO) 500 MG tablet Take 1 tablet (500 mg total) by mouth 2 (two) times daily. 12/14/19   Posey Boyer, MD  doxycycline (VIBRA-TABS) 100 MG tablet Take 1 tablet (100 mg total) by mouth daily. start two days before traveling and continue taking daily for 28 days after returning to [place]. 01/03/20   Rutherford Guys, MD  erythromycin ophthalmic ointment Place a 1/2 inch ribbon of ointment into the lower eyelid (left) 05/14/20   Xaria Judon, Kathleene Hazel, PA  fluticasone (FLONASE) 50 MCG/ACT nasal spray Place 2 sprays into both nostrils daily for 14 days. 05/14/20 05/28/20  Tedd Sias, PA    Allergies    Patient has no known allergies.  Review of Systems   Review of Systems  Constitutional: Negative for chills and fever.  HENT: Negative for congestion.   Eyes: Positive for pain.  Respiratory: Negative for cough and shortness of breath.   Cardiovascular: Negative for chest pain and leg swelling.  Gastrointestinal: Negative for abdominal pain and vomiting.  Genitourinary: Negative for dysuria.  Musculoskeletal: Negative for myalgias.  Skin: Negative for rash.  Neurological:  Positive for headaches. Negative for dizziness.  Psychiatric/Behavioral: Negative for agitation.    Physical Exam Updated Vital Signs BP 116/64   Pulse 88   Temp 98.4 F (36.9 C) (Oral)   Resp 20   Ht 5\' 8"  (1.727 m)   Wt 97.1 kg   LMP 05/01/2020 Comment: neg hcg 05/14/20  SpO2 100%   BMI 32.54 kg/m   Physical Exam Vitals and nursing note reviewed.  Constitutional:      General: She is not in acute distress. HENT:     Head: Normocephalic and atraumatic.     Comments: Question tenderness to palpation of the frontal and maxillary sinuses     Nose: Nose normal.     Mouth/Throat:     Mouth: Mucous membranes are moist.  Eyes:     General: No scleral icterus.    Comments: Extraocular movements are intact.  She does endorse pain in left eye with eye movement.  PERRLA. mild left scleral injection; no proptosis. Left upper eyelid is edematous.  Lower eyelid mildly edematous.  Cardiovascular:     Rate and Rhythm: Normal rate and regular rhythm.     Pulses: Normal pulses.     Heart sounds: Normal heart sounds.  Pulmonary:     Effort: Pulmonary effort is normal. No respiratory distress.     Breath sounds: Normal breath sounds. No wheezing.  Musculoskeletal:     Cervical back: Normal range of motion.     Right lower leg: No edema.     Left lower leg: No edema.  Skin:    General: Skin is warm and dry.     Capillary Refill: Capillary refill takes less than 2 seconds.  Neurological:     Mental Status: She is alert. Mental status is at baseline.  Psychiatric:        Mood and Affect: Mood normal.        Behavior: Behavior normal.     ED Results / Procedures / Treatments   Labs (all labs ordered are listed, but only abnormal results are displayed) Labs Reviewed  I-STAT BETA HCG BLOOD, ED ([facility], [facility], [facility] ONLY)  I-STAT CHEM 8, ED    EKG None  Radiology No results found.  Procedures Procedures (including critical care time)  Medications Ordered in ED Medications  sodium chloride 0.9 % bolus 1,000 mL (0 mLs Intravenous Stopped 05/14/20 1708)  metoCLOPramide (REGLAN) injection 10 mg (10 mg Intravenous Given 05/14/20 1410)  diphenhydrAMINE (BENADRYL) injection 25 mg (25 mg Intravenous Given 05/14/20 1408)  acetaminophen (TYLENOL) tablet 650 mg (650 mg Oral Given 05/14/20 1413)  iohexol (OMNIPAQUE) 300 MG/ML solution 75 mL (75 mLs Intravenous Contrast Given 05/14/20 1621)    ED Course  I have reviewed the triage vital signs and the nursing notes.  Pertinent labs & imaging results that were available during my care of the  patient were reviewed by me and considered in my medical decision making (see chart for details).    MDM Rules/Calculators/A&P                           Patient is a 34 year old female presented today with left-sided headache and left eye pain she has pain with extraocular movements has no other visual symptoms she does have some swelling of the left upper eyelid no proptosis.  No notable chemosis on my examination.  She was sent by urgent care for concern for orbital cellulitis.  She has had some recent sinus  infection which is improving now she has been afebrile.  We will obtain CT imaging of orbits to evaluate for orbital cellulitis although I have low suspicion for this.  I-STAT Chem-8 obtained creatinine within normal limits.  No other abnormalities.  I-STAT hCG negative for pregnancy.   FINDINGS:  Orbits: No substantial periorbital swelling identified. There is no  evidence of postseptal extension. Symmetric and unremarkable  appearance of the globes, extraocular muscles, lacrimal glands, and  optic nerve sheath complexes.    Visualized sinuses: Minor mucosal thickening.    Soft tissues: Prominence of the adenoids.    Limited intracranial: No abnormal enhancement.    IMPRESSION:  No evidence of postseptal cellulitis. Partially imaged minor  paranasal sinus inflammatory changes.    Patient also given headache cocktail of Reglan and Benadryl and Tylenol.  Also received 1 L normal saline.  I reevaluated patient and informed her of her normal CT scan.  She states she feels almost completely better at this time.  I suspect that she has blepharitis of her left upper arm.  Will use erythromycin ointment and will prescribe Flonase for her to use given her sinus symptoms.  No negation for antibiotics at this time given lack of fever, greater than 10 days or second sickening.  Patient given information for ophthalmologist who she will follow up with in the next couple days.  She is  also given return precautions.  Final Clinical Impression(s) / ED Diagnoses Final diagnoses:  Pain of left eye  Blepharitis of left upper eyelid, unspecified type    Rx / DC Orders ED Discharge Orders         Ordered    erythromycin ophthalmic ointment        05/14/20 1733    fluticasone (FLONASE) 50 MCG/ACT nasal spray  Daily        05/14/20 1733           Tedd Sias, [place] 05/19/20 1216    Veryl Speak, MD 05/19/20 1454

## 2020-05-14 NOTE — Discharge Instructions (Signed)
Your CT scan was negative for orbital cellulitis.  Please do warm compresses 4 times per day Please use the antibiotic ointment twice daily.  Follow up with the ophthalmologist recommended.

## 2020-05-14 NOTE — Discharge Instructions (Signed)
Please go to emergency department at [facility] for further management given concern for orbital cellulitis.

## 2020-05-14 NOTE — ED Triage Notes (Addendum)
Pt reports eye on upper lid on Lt . Pt also reports her face was swollen over the weekend. Pt also reports she has a HE 8/10 and diarrhea.

## 2021-02-02 IMAGING — CT CT ORBITS W/ CM
3 series · 10 of 47 positions shown, 11 images · IV contrast (APPLIED)
Comparison: None.

CLINICAL DATA: Left eye swelling preceded by sinus symptoms for
several days

EXAM:
CT ORBITS WITH CONTRAST
TECHNIQUE: Multidetector CT images was performed according to the standard
protocol following intravenous contrast administration.
CONTRAST:  75mL OMNIPAQUE IOHEXOL 300 MG/ML  SOLN

[Series 3: facial/orbits w 2.0 st · axial · 0.43mm/px · z∈[-151,-103]mm · 4 of 34 slices shown, 5 images]
[im 5/34  brain]
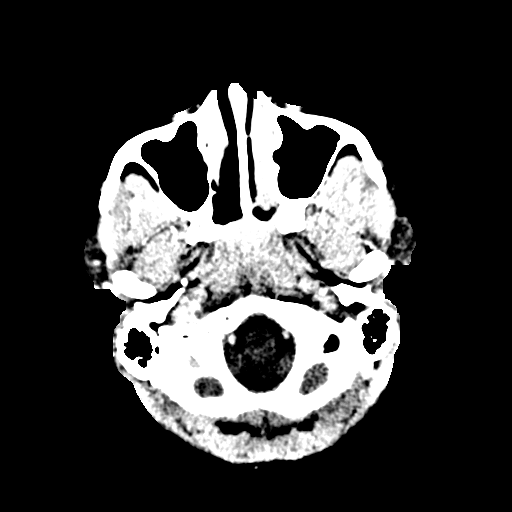
[im 5/34  bone]
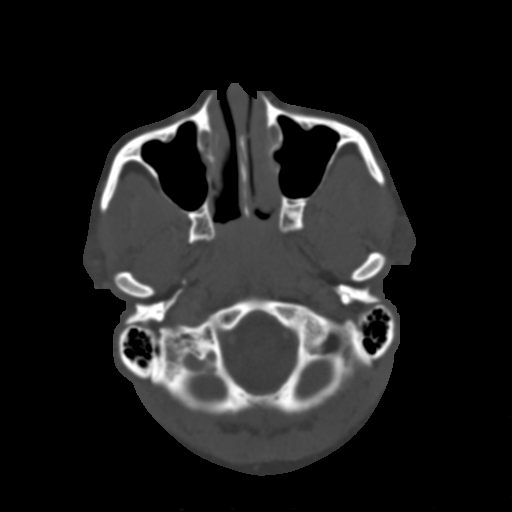
[im 13/34  bone]
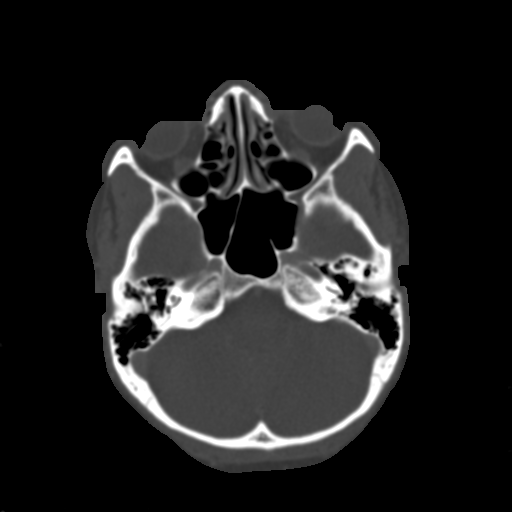
[im 21/34  bone]
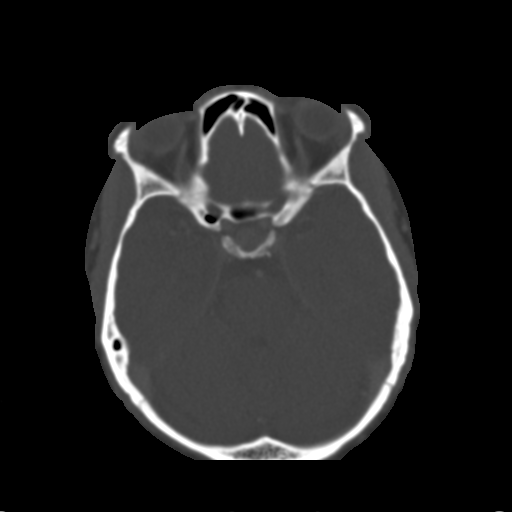
[im 29/34  bone]
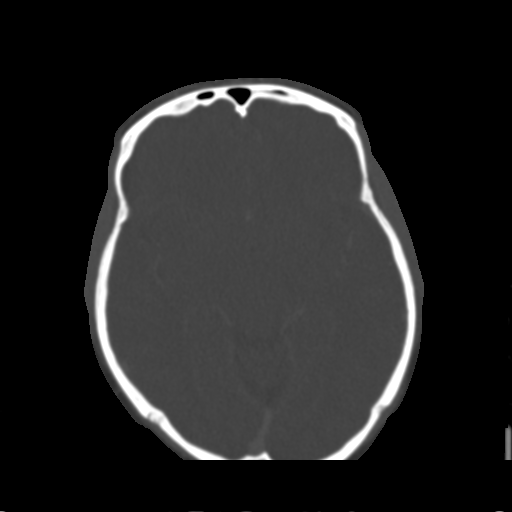

[Series 7: coronal soft tissue · coronal · 0.15mm/px · 3 of 76 slices shown]
[im 26/76  bone]
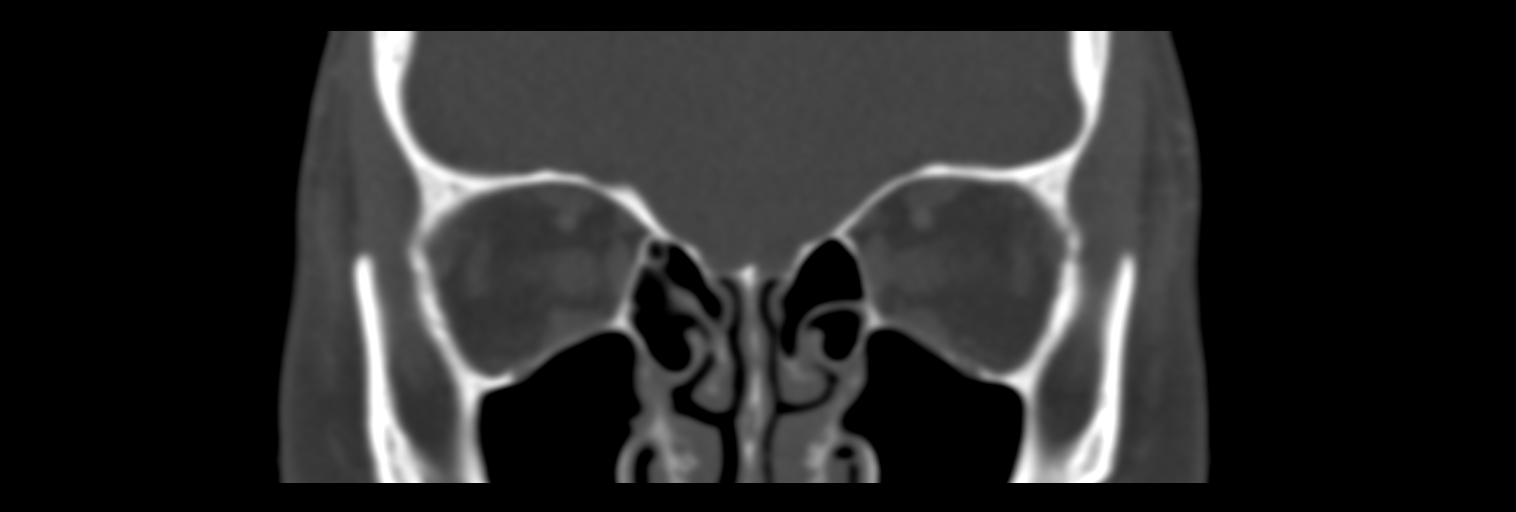
[im 34/76  bone]
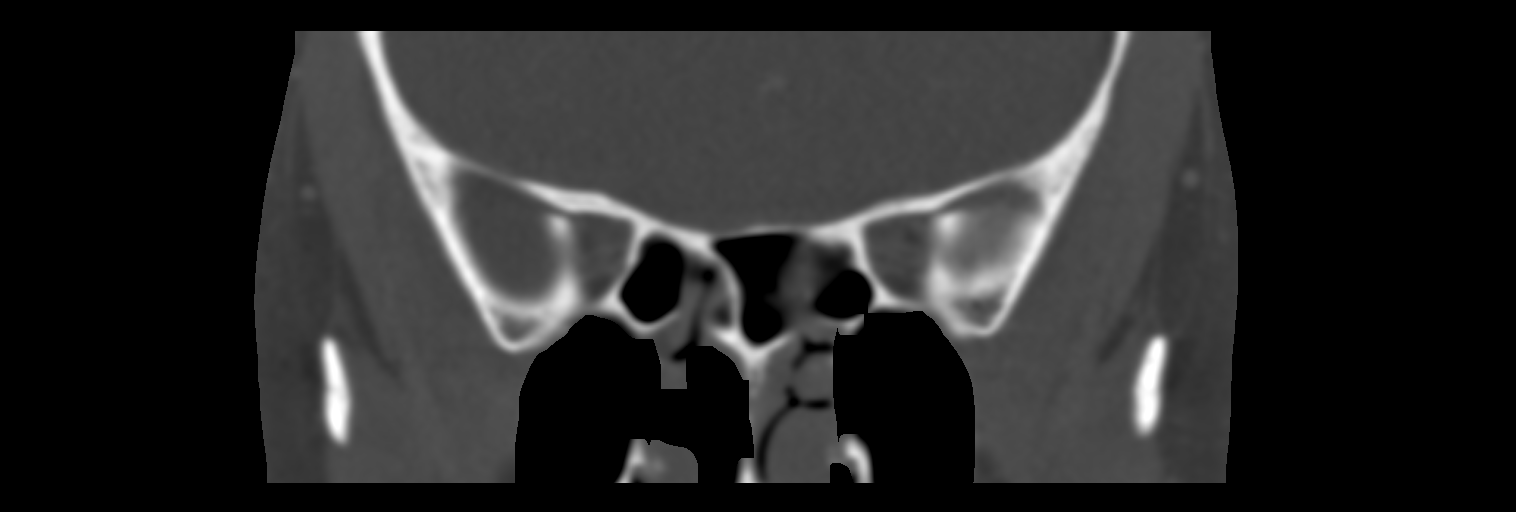
[im 42/76  bone]
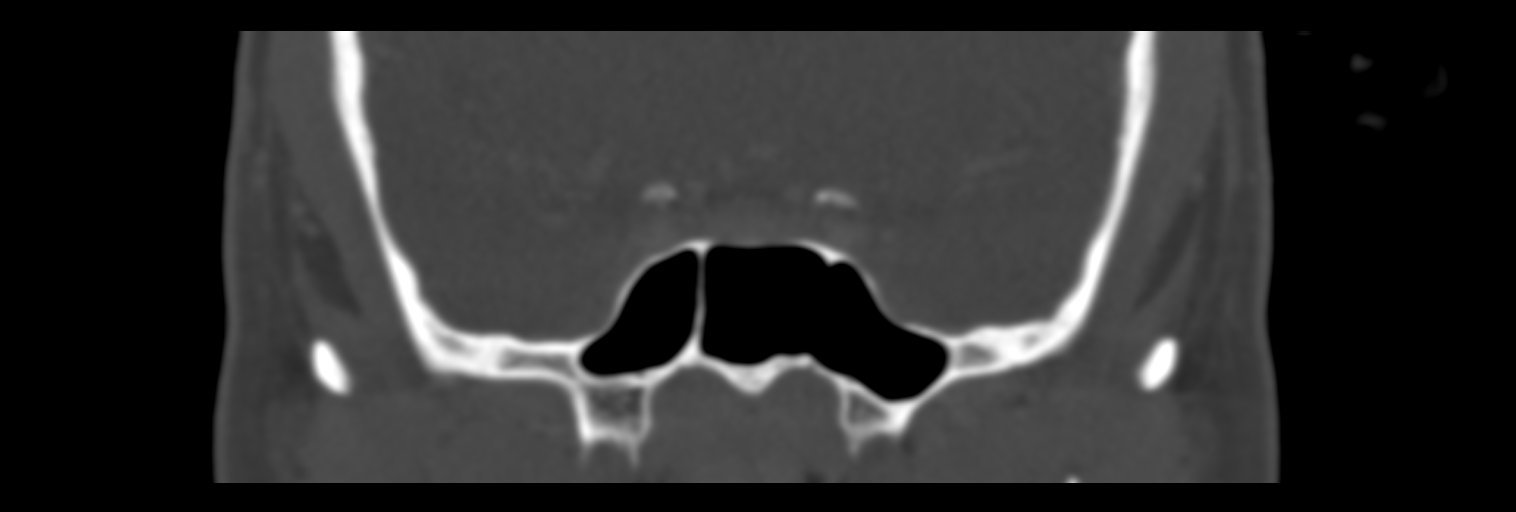

[Series 8: sagittal soft tissue · sagittal · 0.16mm/px · 3 of 94 slices shown]
[im 32/94  bone]
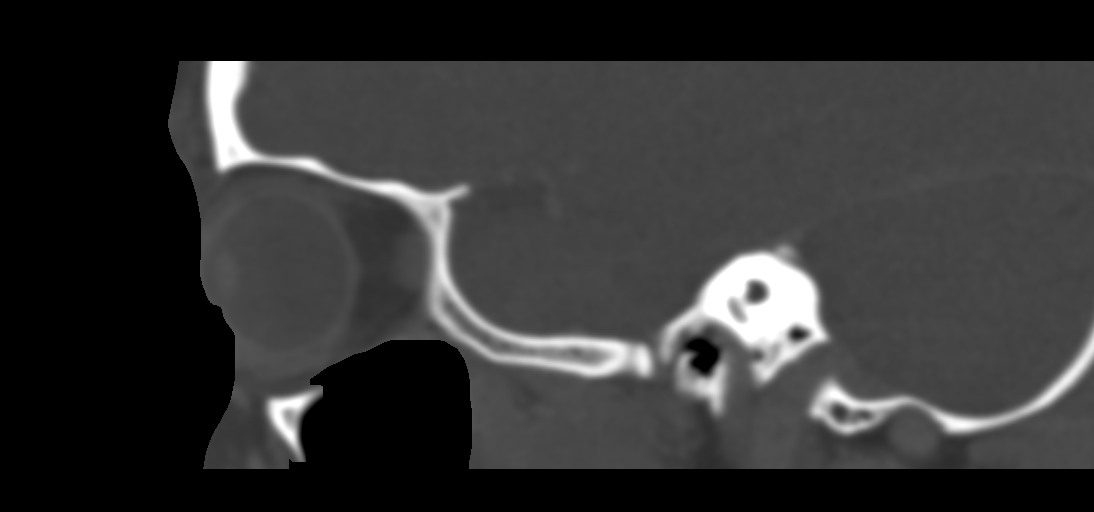
[im 47/94  bone]
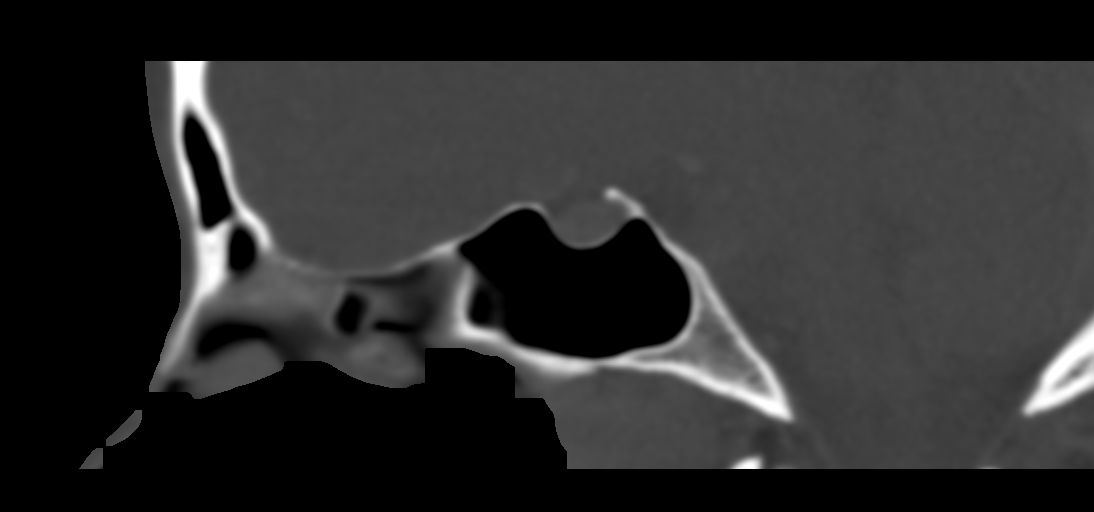
[im 63/94  bone]
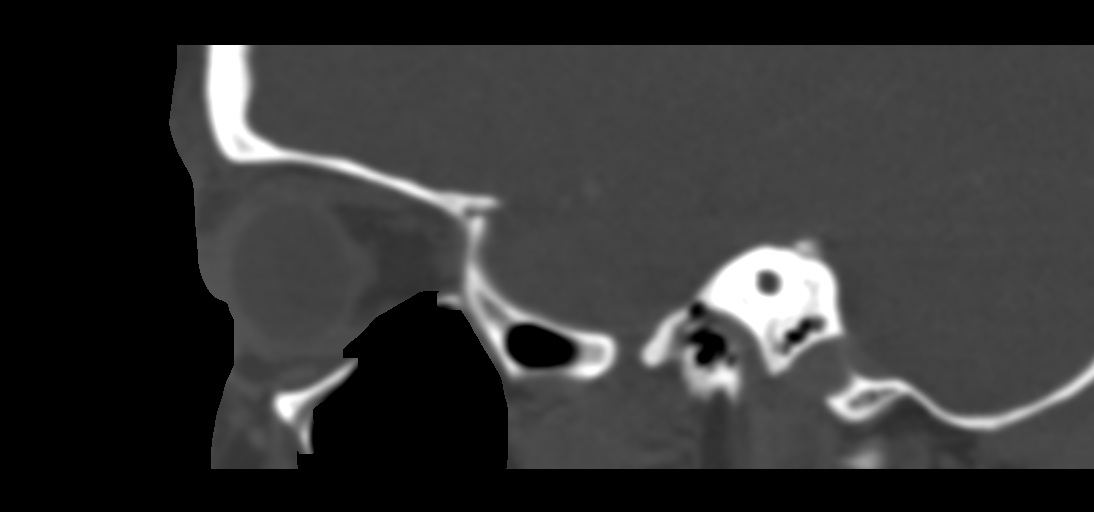

[10 of 47 positions shown; findings below may reference images not displayed]

FINDINGS: Orbits: No substantial periorbital swelling identified. There is no
evidence of postseptal extension. Symmetric and unremarkable
appearance of the globes, extraocular muscles, lacrimal glands, and
optic nerve sheath complexes.

Visualized sinuses: Minor mucosal thickening.

Soft tissues: Prominence of the adenoids.

Limited intracranial: No abnormal enhancement.
IMPRESSION: No evidence of postseptal cellulitis. Partially imaged minor
paranasal sinus inflammatory changes.

## 2021-06-10 NOTE — Progress Notes (Signed)
Erroneous encounter

## 2021-06-17 ENCOUNTER — Encounter: Payer: 59 | Admitting: Family

## 2022-04-05 ENCOUNTER — Ambulatory Visit: Payer: 59 | Admitting: Family Medicine

## 2022-04-05 ENCOUNTER — Other Ambulatory Visit (HOSPITAL_COMMUNITY)
Admission: RE | Admit: 2022-04-05 | Discharge: 2022-04-05 | Disposition: A | Discharge status: Home / Self Care | Payer: 59 | Source: Ambulatory Visit | Attending: Family Medicine | Admitting: Family Medicine

## 2022-04-05 ENCOUNTER — Encounter: Payer: Self-pay | Admitting: Family Medicine

## 2022-04-05 VITALS — BP 110/70 | HR 83 | Temp 98.1°F | Ht 68.5 in | Wt 223.0 lb

## 2022-04-05 DIAGNOSIS — Z124 Encounter for screening for malignant neoplasm of cervix: Secondary | ICD-10-CM | POA: Diagnosis present

## 2022-04-05 DIAGNOSIS — R1900 Intra-abdominal and pelvic swelling, mass and lump, unspecified site: Secondary | ICD-10-CM | POA: Diagnosis not present

## 2022-04-05 DIAGNOSIS — N63 Unspecified lump in unspecified breast: Secondary | ICD-10-CM | POA: Diagnosis not present

## 2022-04-05 DIAGNOSIS — E010 Iodine-deficiency related diffuse (endemic) goiter: Secondary | ICD-10-CM

## 2022-04-05 DIAGNOSIS — Z01419 Encounter for gynecological examination (general) (routine) without abnormal findings: Secondary | ICD-10-CM

## 2022-04-05 DIAGNOSIS — Z0000 Encounter for general adult medical examination without abnormal findings: Secondary | ICD-10-CM

## 2022-04-05 DIAGNOSIS — Z0001 Encounter for general adult medical examination with abnormal findings: Secondary | ICD-10-CM | POA: Diagnosis not present

## 2022-04-05 LAB — RESULTS CONSOLE HPV: CHL HPV: NEGATIVE

## 2022-04-05 LAB — HM PAP SMEAR: HM Pap smear: NORMAL

## 2022-04-05 NOTE — Progress Notes (Signed)
Phone [phone]   Subjective:   Patient is a 36 y.o. female presenting for annual physical.    Chief Complaint  Patient presents with   Establish Care    Need annual exam and form completed Due for pap    New pt-needs annual and pap and work form.  Pap 2019.  Not exercising.  Gaining wt 40#.  G0 H/o breast cysts-never heard from rad on repeat  See problem oriented charting- ROS- ROS: Gen: no fever, chills  Skin: no rash, itching ENT: no ear pain, ear drainage, nasal congestion, rhinorrhea, sinus pressure, sore throat Eyes: no blurry vision, double vision Resp: no cough, wheeze,SOB CV: no CP, palpitations, LE edema,  GI: no heartburn, n/v/d/, abd pain.  Chronic constipation-can go 3-4 days.   GU: no dysuria, urgency, frequency, hematuria.  Menses regular. MSK: no joint pain, myalgias, back pain Neuro: no dizziness, weakness, vertigo.  Ha since yesterday.  Gets about once/wk.  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  Ibu yest not work. Severe at times. No photo/phono.  No n/v.   Going on past 1 yr or so.  Mom has migraine.  Psych: no depression, anxiety, insomnia, SI   The following were reviewed and entered/updated in epic: Past Medical History:  Diagnosis Date   Anemia    Bilateral fibroadenomas of breasts    GERD (gastroesophageal reflux disease)    Leukocytopenia    s/p hematology consultation 2013.   Sickle cell trait [facility])    Patient Active Problem List   Diagnosis Date Noted   [place], BREAST 11/16/2009   ANA POSITIVE 10/05/2009   URINALYSIS, ABNORMAL 07/29/2009   SICKLE-CELL TRAIT 07/06/2009   OTHER NEUTROPENIA 07/06/2009   BREAST TENDERNESS 06/10/2009   BREAST MASS, RIGHT 06/10/2009   Past Surgical History:  Procedure Laterality Date   BREAST CYST EXCISION Bilateral 2006   BREAST SURGERY      Family History  Problem Relation Age of Onset   Hypertension Mother    Hypertension Father    Asthma Brother    Asthma Sister     Medications- reviewed  and updated Current Outpatient Medications  Medication Sig Dispense Refill   ciprofloxacin (CIPRO) 500 MG tablet Take 1 tablet (500 mg total) by mouth 2 (two) times daily. (Patient not taking: Reported on 04/05/2022) 10 tablet 0   doxycycline (VIBRA-TABS) 100 MG tablet Take 1 tablet (100 mg total) by mouth daily. start two days before traveling and continue taking daily for 28 days after returning to [place]. (Patient not taking: Reported on 04/05/2022) 90 tablet 0   erythromycin ophthalmic ointment Place a 1/2 inch ribbon of ointment into the lower eyelid (left) (Patient not taking: Reported on 04/05/2022) 3.5 g 0   fluticasone (FLONASE) 50 MCG/ACT nasal spray Place 2 sprays into both nostrils daily for 14 days. 11.1 mL 0   No current facility-administered medications for this visit.    Allergies-reviewed and updated No Known Allergies  Social History   Social History Narrative   Marital status: single;    From [place], [place]; [place] since 2009.      Children:  None      Lives: with parent/mom, brothers.      Employment:  Works for [employer] in [employer]; bilingual Economist.      Tobacco: none      Alcohol: none      Drugs: none      Exercise:  [employer]      Sexual activity:  None;  last sexual activity 2005; total partners = 1; no STDs.     Objective  Objective:  BP 110/70   Pulse 83   Temp 98.1 F (36.7 C) (Temporal)   Ht 5' 8.5" (1.74 m)   Wt 223 lb (101.2 kg)   LMP 03/26/2022 (Exact Date)   SpO2 99%   BMI 33.41 kg/m  Physical Exam  Gen: WDWN NAD OAAF HEENT: NCAT, conjunctiva not injected, sclera nonicteric TM WNL B, OP moist, no exudates  NECK:  supple,+thyromegaly, no nodes, no carotid bruits CARDIAC: RRR, S1S2+, no murmur. DP 2+B LUNGS: CTAB. No wheezes ABDOMEN:  BS+, soft, NTND, No HSM,+ mass from pelvis above umbilicus and to L EXT:  no edema MSK: no gross abnormalities. MS 5/5 all 4 NEURO: A&O x3.  CN II-XII intact.  PSYCH: normal mood. Good eye  contact   Breasts: Examined lying and sitting.              Right:   FCt masses, + chronic nipple retractions,no  nipple discharge or axillary adenopathy.               Left:     +FC  masses,+ chronic nipple retractions, no nipple discharge or axillary adenopathy. Genitourinary              Inguinal/mons:  Normal without inguinal adenopathy             External genitalia:  Normal appearing vulva with no masses, tenderness, or lesions             BUS/Urethra/Skene's glands:  Normal             Vagina:  Normal appearing with normal color and discharge, no lesions             Cervix:  Normal appearing without discharge or lesions             Uterus:  large-above umbilicus vs other             Adnexa/parametria:                           Rt:        Normal in size, without masses or tenderness.                         Lt:        Normal in size, without masses or tenderness.             Anus and perineum: Normal  Chaperone present QJ   Assessment and Plan   Health Maintenance counseling: 1. Anticipatory guidance: Patient counseled regarding regular dental exams q6 months, eye exams,  avoiding smoking and second hand smoke, limiting alcohol to 1 beverage per day, no illicit drugs.   2. Risk factor reduction:  Advised patient of need for regular exercise and diet rich and fruits and vegetables to reduce risk of heart attack and stroke. Exercise- encouraged .  Wt Readings from Last 3 Encounters:  04/05/22 223 lb (101.2 kg)  05/14/20 214 lb (97.1 kg)  05/14/20 214 lb (97.1 kg)   3. Immunizations/screenings/ancillary studies Immunization History  Administered Date(s) Administered   Influenza,inj,Quad PF,6+ Mos 05/01/2014   Moderna Sars-Covid-2 Vaccination 10/08/2019, 11/05/2019   Td 06/19/2007   Tdap 01/18/2018   Health Maintenance Due  Topic Date Due   PAP SMEAR-Modifier  01/18/2021    4. Cervical cancer screening: ordered  Problem List  Items Addressed This Visit   None Visit  Diagnoses     Wellness examination    -  Primary   Relevant Orders   Comprehensive metabolic panel   Hemoglobin A1c   Lipid panel   TSH   CBC with Differential/Platelet   Well woman exam with routine gynecological exam       Screening for cervical cancer       Relevant Orders   Cytology - PAP( [place])      Wellness-anticipatory guidance.  Work on [employer].  Check CBC,CMP,lipids,TSH, A1C.  F/u 1 yr  HA-suspect migraine-pt wants to work on diet/exercise/sleep first.   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  H/o breast cysts-due for mamm/u/s 5.  Thyromegaly-check u/s 6.  Abd mass-feels most likely enlarged uterus but could be ovary, other.  Check u/s pelvis  Recommended follow up: [address] in about 1 year (around 04/06/2023) for annual. No future appointments.  Lab/Order associations:+ fasting   ICD-10-CM   1. Wellness examination  Z00.00 Comprehensive metabolic panel    Hemoglobin A1c    Lipid panel    TSH    CBC with Differential/Platelet    2. Well woman exam with routine gynecological exam  Z01.419     3. Screening for cervical cancer  Z12.4 Cytology - PAP( )      No orders of the defined types were placed in this encounter.    AWellington Hampshire MD

## 2022-04-05 NOTE — Patient Instructions (Signed)
Welcome to [facility]! It was a pleasure meeting you today.  As discussed, Please schedule a 12 month follow up visit today.  Start with benefiber or metamucil daily.  Massage belly.  If not helping after few weeks, try miralax daily.  [phone] for mammogram, other studies    PLEASE NOTE:  If you had any LAB tests please let us know if you have not heard back within a few days. You may see your results on MyChart before we have a chance to review them but we will give you a call once they are reviewed by [place]. If we ordered any REFERRALS today, please let us know if you have not heard from their office within the next week.  Let us know through MyChart if you are needing REFILLS, or have your pharmacy send [place] the request. You can also use MyChart to communicate with me or any office staff.  Please try these tips to maintain a healthy lifestyle:  Eat most of your calories during the day when you are active. Eliminate processed foods including packaged sweets (pies, cakes, cookies), reduce intake of potatoes, white bread, white pasta, and white rice. Look for whole grain options, oat flour or almond flour.  Each meal should contain half fruits/vegetables, one quarter protein, and one quarter carbs (no bigger than a computer mouse).  Cut down on sweet beverages. This includes juice, soda, and sweet tea. Also watch fruit intake, though this is a healthier sweet option, it still contains natural sugar! Limit to 3 servings daily.  Drink at least 1 glass of water with each meal and aim for at least 8 glasses per day  Exercise at least 150 minutes every week.

## 2022-04-06 LAB — COMPREHENSIVE METABOLIC PANEL
ALT: 15 U/L (ref 0–35)
AST: 18 U/L (ref 0–37)
Albumin: 4 g/dL (ref 3.5–5.2)
Alkaline Phosphatase: 63 U/L (ref 39–117)
BUN: 10 mg/dL (ref 6–23)
CO2: 28 mEq/L (ref 19–32)
Calcium: 8.9 mg/dL (ref 8.4–10.5)
Chloride: 102 mEq/L (ref 96–112)
Creatinine, Ser: 0.77 mg/dL (ref 0.40–1.20)
GFR: 99.79 mL/min (ref >60.00)
Glucose, Bld: 85 mg/dL (ref 70–99)
Potassium: 3.9 mEq/L (ref 3.5–5.1)
Sodium: 136 mEq/L (ref 135–145)
Total Bilirubin: 0.7 mg/dL (ref 0.2–1.2)
Total Protein: 8.2 g/dL (ref 6.0–8.3)

## 2022-04-06 LAB — CBC WITH DIFFERENTIAL/PLATELET
Basophils Absolute: 0 10*3/uL (ref 0.0–0.1)
Basophils Relative: 0.7 % (ref 0.0–3.0)
Eosinophils Absolute: 0 10*3/uL (ref 0.0–0.7)
Eosinophils Relative: 0.6 % (ref 0.0–5.0)
HCT: 36.5 % (ref 36.0–46.0)
Hemoglobin: 11.7 g/dL — ABNORMAL LOW (ref 12.0–15.0)
Lymphocytes Relative: 36.4 % (ref 12.0–46.0)
Lymphs Abs: 1.8 10*3/uL (ref 0.7–4.0)
MCHC: 32.1 g/dL (ref 30.0–36.0)
MCV: 79.1 fl (ref 78.0–100.0)
Monocytes Absolute: 0.5 10*3/uL (ref 0.1–1.0)
Monocytes Relative: 10 % (ref 3.0–12.0)
Neutro Abs: 2.6 10*3/uL (ref 1.4–7.7)
Neutrophils Relative %: 52.3 % (ref 43.0–77.0)
Platelets: 173 10*3/uL (ref 150.0–400.0)
RBC: 4.61 Mil/uL (ref 3.87–5.11)
RDW: 13.8 % (ref 11.5–15.5)
WBC: 4.9 10*3/uL (ref 4.0–10.5)

## 2022-04-06 LAB — TSH: TSH: 1.02 u[IU]/mL (ref 0.35–5.50)

## 2022-04-06 LAB — LIPID PANEL
Cholesterol: 157 mg/dL (ref 0–200)
HDL: 36.6 mg/dL — ABNORMAL LOW (ref >39.00)
LDL Cholesterol: 106 mg/dL — ABNORMAL HIGH (ref 0–99)
NonHDL: 120.27
Total CHOL/HDL Ratio: 4
Triglycerides: 73 mg/dL (ref 0.0–149.0)
VLDL: 14.6 mg/dL (ref 0.0–40.0)

## 2022-04-06 LAB — HEMOGLOBIN A1C: Hgb A1c MFr Bld: 6.1 % (ref 4.6–6.5)

## 2022-04-06 NOTE — Progress Notes (Signed)
1.  HDL cholesterol (good one) is too low.  Increase exercise 2.  Slight anemia-take a multivitamin with iron 3.A1C(3 month average of sugars) is elevated.  This is considered PreDiabetes.  Work on diet-decrease sugars and starches and aim for 30 minutes of exercise 5 days/week to prevent progression to diabetes  4.  Rest the labs are okay

## 2022-04-07 ENCOUNTER — Encounter: Payer: Self-pay | Admitting: Family Medicine

## 2022-04-07 LAB — CYTOLOGY - PAP
Comment: NEGATIVE
Diagnosis: NEGATIVE
Diagnosis: REACTIVE
High risk HPV: NEGATIVE

## 2022-04-08 ENCOUNTER — Ambulatory Visit
Admission: RE | Admit: 2022-04-08 | Discharge: 2022-04-08 | Disposition: A | Discharge status: Home / Self Care | Payer: 59 | Source: Ambulatory Visit | Attending: Family Medicine | Admitting: Family Medicine

## 2022-04-08 DIAGNOSIS — R1900 Intra-abdominal and pelvic swelling, mass and lump, unspecified site: Secondary | ICD-10-CM

## 2022-04-08 DIAGNOSIS — E010 Iodine-deficiency related diffuse (endemic) goiter: Secondary | ICD-10-CM

## 2022-04-09 ENCOUNTER — Other Ambulatory Visit: Payer: Self-pay | Admitting: *Deleted

## 2022-04-09 DIAGNOSIS — D219 Benign neoplasm of connective and other soft tissue, unspecified: Secondary | ICD-10-CM

## 2022-04-20 ENCOUNTER — Ambulatory Visit: Payer: 59 | Admitting: Radiology

## 2022-04-23 ENCOUNTER — Ambulatory Visit: Payer: 59 | Admitting: Obstetrics & Gynecology

## 2022-04-23 ENCOUNTER — Encounter: Payer: Self-pay | Admitting: Obstetrics & Gynecology

## 2022-04-23 VITALS — BP 138/84 | Ht 68.5 in | Wt 219.0 lb

## 2022-04-23 DIAGNOSIS — D252 Subserosal leiomyoma of uterus: Secondary | ICD-10-CM

## 2022-04-23 NOTE — Progress Notes (Signed)
    Timisha Mondry [facility] Oct 16, 1985 [id number]        36 y.o.  G0P0000 Boyfriend  RP: Counseling and management of large SS Fibroids per Pelvic US  HPI: Menses regular normal every month.  Mild dysmenorrhea.  No BTB.  No abdominopelvic pain.  No desire to conceive for about 2 years.  Currently abstinent.  Pap test neg and Hb 11.7 on 04/05/22.   OB History  Gravida Para Term Preterm AB Living  0 0 0 0 0 0  SAB IAB Ectopic Multiple Live Births  0 0 0 0 0    Past medical history,surgical history, problem list, medications, allergies, family history and social history were all reviewed and documented in the EPIC chart.   Directed ROS with pertinent positives and negatives documented in the history of present illness/assessment and plan.  Exam:  Vitals:   04/23/22 1019  BP: 138/84  Weight: 219 lb (99.3 kg)  Height: 5' 8.5" (1.74 m)   General appearance:  Normal  Abdomen: Uterine height with Fibroids just below the umbilicus  Gynecologic exam: Vulva normal.  Bimanual exam: Uterus is nodular, increased to about 18 cm, mobile, NT.    Pap 04/05/22 Negative  Pelvic US 04/08/22: Uterus   Measurements: Estimated 18.6 x 9.0 x 8.1 cm = volume: 705 mL. Large exophytic leiomyoma anterior upper uterus 6.7 x 5.9 x 8.1 cm. Additional large exophytic leiomyoma at posterior fundus 8.2 x 7.9 x 12.9 cm. Small exophytic fundal leiomyoma posteriorly 4.2 x 4.6 x 4.6 cm.   Endometrium   Thickness: Questionable segment 12 mm thick visualized at mid uterus, remainder obscured by leiomyomata. No gross endometrial fluid   Right ovary   Measurements: 7.6 x 2.0 x 4.2 cm = volume: 20.2 mL. Normal morphology without mass   Left ovary   Measurements: 4.2 x 1.8 x 2.9 cm = volume: 11.1 mL. Normal morphology without mass   Other findings   No free pelvic fluid or additional pelvic masses.   IMPRESSION: Enlarged uterus containing multiple leiomyomata up to 12.9 cm and 8.1 cm in greatest  sizes.   Remainder of exam unremarkable.  HB 11.7 on 04/05/22   Assessment/Plan:  36 y.o. G0P0000   1. Fibroids, subserous Menses regular normal every month.  Mild dysmenorrhea.  No BTB.  No abdominopelvic pain.  No desire to conceive for about 2 years.  Currently abstinent.  Pap Neg and Hb 11.7 on 04/05/22.  Pelvic US 04/08/22 thoroughly reviewed with patient.  Although the Fibroids are large at 12.9, 8.1 and 46 cm, they are all SS and minimally symptomatic.  Patient is not planning conception for the next 2 years and is currently abstinent.  Counseling on the risks/benefits of medical treatments including DepoLupron and Myfembree, surgery especially Robotic Myomectomies versus observation.  Patient prefers observation at this time.  Bleeding and pain precautions reviewed.  Iron rich/Iron supplement recommended.  F/U in 6 months for repeat Pelvic US to reassess the Fibroids. - US Transvaginal Non-OB; Future   Princess Bruins MD, 10:47 AM 04/23/2022

## 2022-04-27 ENCOUNTER — Telehealth: Payer: Self-pay | Admitting: Family Medicine

## 2022-04-27 NOTE — Telephone Encounter (Signed)
Pt states: -Recently seen by PCP. -"360 health check" form was given to PCP team.  -pcp team needed to finish their part, then send back to patient. -the deadline has past for the form submission  -would like to know where the form is.   Pt requests: -Call back

## 2022-04-27 NOTE — Telephone Encounter (Signed)
Returned call to patient and informed her that form was faxed on 04/07/22 to the number on the form and if she wanted to pick up a copy from the office she could, form cannot be emailed to her directly. Patient verbalized understanding.

## 2022-05-31 ENCOUNTER — Ambulatory Visit
Admission: RE | Admit: 2022-05-31 | Discharge: 2022-05-31 | Disposition: A | Discharge status: Home / Self Care | Payer: 59 | Source: Ambulatory Visit | Attending: Family Medicine | Admitting: Family Medicine

## 2022-05-31 DIAGNOSIS — N63 Unspecified lump in unspecified breast: Secondary | ICD-10-CM

## 2022-06-08 ENCOUNTER — Telehealth (INDEPENDENT_AMBULATORY_CARE_PROVIDER_SITE_OTHER): Payer: 59 | Admitting: Family Medicine

## 2022-06-08 ENCOUNTER — Encounter: Payer: Self-pay | Admitting: Family Medicine

## 2022-06-08 DIAGNOSIS — D219 Benign neoplasm of connective and other soft tissue, unspecified: Secondary | ICD-10-CM

## 2022-06-08 NOTE — Patient Instructions (Signed)
It was very nice to see you today!  Happy New year!  Call and follow-up with Gyn to discuss proceeding w/treatment.     PLEASE NOTE:  If you had any lab tests please let us know if you have not heard back within a few days. You may see your results on MyChart before we have a chance to review them but we will give you a call once they are reviewed by [place]. If we ordered any referrals today, please let us know if you have not heard from their office within the next week.   Please try these tips to maintain a healthy lifestyle:  Eat most of your calories during the day when you are active. Eliminate processed foods including packaged sweets (pies, cakes, cookies), reduce intake of potatoes, white bread, white pasta, and white rice. Look for whole grain options, oat flour or almond flour.  Each meal should contain half fruits/vegetables, one quarter protein, and one quarter carbs (no bigger than a computer mouse).  Cut down on sweet beverages. This includes juice, soda, and sweet tea. Also watch fruit intake, though this is a healthier sweet option, it still contains natural sugar! Limit to 3 servings daily.  Drink at least 1 glass of water with each meal and aim for at least 8 glasses per day  Exercise at least 150 minutes every week.

## 2022-06-08 NOTE — Progress Notes (Signed)
MyChart Video Visit    Virtual Visit via Video Note   This visit type was conducted due to national recommendations for restrictions regarding the COVID-19 Pandemic (e.g. social distancing) in an effort to limit this patient's exposure and mitigate transmission in our community. This patient is at least at moderate risk for complications without adequate follow up. This format is felt to be most appropriate for this patient at this time. Physical exam was limited by quality of the video and audio technology used for the visit. CMA was able to get the patient set up on a video visit.  Patient location: Home. Patient and provider in visit Provider location: Office  I discussed the limitations of evaluation and management by telemedicine and the availability of in person appointments. The patient expressed understanding and agreed to proceed.  Visit Date: 06/08/2022  Today's healthcare provider: Wellington Hampshire, MD     Subjective:    Patient ID: Christine Holland, female    DOB: 23-Apr-1986, 36 y.o.   MRN: [id number]  Chief Complaint  Patient presents with   Discuss imaging.    HPI Fibroid uterus.  Saw Gyn.  Wants to discuss plan more.  Will have f/u u/s in May, but wants to know if should do surgery now.  Was told if wants to conceive, would need surgery and then wait 1 yr to get pregnant.  So, considering doing surgery now rather than waiting past June.   Past Medical History:  Diagnosis Date   Anemia    Bilateral fibroadenomas of breasts    Fibroid    GERD (gastroesophageal reflux disease)    Leukocytopenia    s/p hematology consultation 2013.   Sickle cell trait [facility])     Past Surgical History:  Procedure Laterality Date   BREAST CYST EXCISION Bilateral 2006   BREAST SURGERY      No outpatient medications prior to visit.   No facility-administered medications prior to visit.    No Known Allergies      Objective:     Physical Exam  Vitals and nursing note  reviewed.  Constitutional:      General:  is not in acute distress.    Appearance: Normal appearance.  HENT:     Head: Normocephalic.  Pulmonary:     Effort: No respiratory distress.  Skin:    General: Skin is dry.     Coloration: Skin is not pale.  Neurological:     Mental Status: Pt is alert and oriented to person, place, and time.  Psychiatric:        Mood and Affect: Mood normal.   There were no vitals taken for this visit.  Wt Readings from Last 3 Encounters:  04/23/22 219 lb (99.3 kg)  04/05/22 223 lb (101.2 kg)  05/14/20 214 lb (97.1 kg)       Assessment & Plan:   Problem List Items Addressed This Visit   None Visit Diagnoses     Fibroids    -  Primary      Uterine fibroids-pt has questions.  Cannot predict how long there or how fast growing.  She is reconsidering doing surgery sooner than later so can heal rather than waiting till ready for pregnancy.  Advised to call and sch w/gyn since possibly wanting to proceed rather than waiting.  They will be able to answer questions better.   No orders of the defined types were placed in this encounter.   I discussed the assessment and  treatment plan with the patient. The patient was provided an opportunity to ask questions and all were answered. The patient agreed with the plan and demonstrated an understanding of the instructions.   The patient was advised to call back or seek an in-person evaluation if the symptoms worsen or if the condition fails to improve as anticipated.  I provided 10 minutes of face-to-face time during this encounter.   Wellington Hampshire, MD [place] [phone] (phone) [phone] (fax)  [place]

## 2022-10-18 ENCOUNTER — Ambulatory Visit: Payer: 59 | Admitting: Family Medicine

## 2022-10-18 VITALS — BP 112/80 | HR 93 | Temp 98.2°F | Resp 18 | Ht 68.5 in | Wt 217.5 lb

## 2022-10-18 DIAGNOSIS — J069 Acute upper respiratory infection, unspecified: Secondary | ICD-10-CM

## 2022-10-18 DIAGNOSIS — H10023 Other mucopurulent conjunctivitis, bilateral: Secondary | ICD-10-CM

## 2022-10-18 MED ORDER — OFLOXACIN 0.3 % OP SOLN: 1.0000 [drp] | Freq: Four times a day (QID) | OPHTHALMIC | 0 refills | Status: DC

## 2022-10-18 MED ORDER — PREDNISONE 20 MG PO TABS: 40.0000 mg | ORAL_TABLET | Freq: Every day | ORAL | 0 refills | Status: AC

## 2022-10-18 MED ORDER — AZITHROMYCIN 250 MG PO TABS: ORAL_TABLET | ORAL | 0 refills | Status: AC

## 2022-10-18 NOTE — Patient Instructions (Signed)
Start all the medications with supper, then tomorrow-continue taking in the moning with food

## 2022-10-18 NOTE — Progress Notes (Signed)
Subjective:     Patient ID: Christine Holland, female    DOB: 1986-02-11, 37 y.o.   MRN: [id number]  Chief Complaint  Patient presents with   Eye Problem    Bilateral eye irritation, pain, redness, white drainage that started on yesterday   Nasal Congestion    Sx started Friday, took Tylenol,  Zyrtec and Theraflu   Dry Mouth     Started 2 days, concerned that mouth is really dry when going to sleep and waking up    HPI Congestion, ears, dry mouth, eyes swelling and headache(s) and eyes discharge-white.  Mouth very dry.  Fever on 3 days ago.  No cough.  No SHORTNESS OF BREATH.   Not the "normal allergies".  COVID test negative 2 days ago.   There are no preventive care reminders to display for this patient.  Past Medical History:  Diagnosis Date   Anemia    Bilateral fibroadenomas of breasts    Fibroid    GERD (gastroesophageal reflux disease)    Leukocytopenia    s/p hematology consultation 2013.   Sickle cell trait (HCC)     Past Surgical History:  Procedure Laterality Date   BREAST CYST EXCISION Bilateral 2006   BREAST SURGERY       Current Outpatient Medications:    azithromycin (ZITHROMAX) 250 MG tablet, Take 2 tablets on day 1, then 1 tablet daily on days 2 through 5, Disp: 6 tablet, Rfl: 0   cetirizine (ZYRTEC) 10 MG tablet, Take 10 mg by mouth daily., Disp: , Rfl:    ofloxacin (OCUFLOX) 0.3 % ophthalmic solution, Place 1 drop into both eyes 4 (four) times daily., Disp: 5 mL, Rfl: 0   predniSONE (DELTASONE) 20 MG tablet, Take 2 tablets (40 mg total) by mouth daily with breakfast for 5 days., Disp: 10 tablet, Rfl: 0  No Known Allergies ROS neg/noncontributory except as noted HPI/below      Objective:     BP 112/80   Pulse 93   Temp 98.2 F (36.8 C) (Temporal)   Resp 18   Ht 5' 8.5" (1.74 m)   Wt 217 lb 8 oz (98.7 kg)   SpO2 99%   BMI 32.59 kg/m  Wt Readings from Last 3 Encounters:  10/18/22 217 lb 8 oz (98.7 kg)  04/23/22 219 lb (99.3 kg)   04/05/22 223 lb (101.2 kg)    Physical Exam   Gen: WDWN NAD HEENT: NCAT, conjunctiva + injected some D/c, sclera nonicteric TM WNL B, OP moist, no exudates a lot of swelling eyes,nares. OP NECK:  supple, no thyromegaly, no nodes, no carotid bruits CARDIAC: RRR, S1S2+, no murmur.  LUNGS: CTAB. No wheezes EXT:  no edema MSK: no gross abnormalities.  NEURO: A&O x3.  CN II-XII intact.  PSYCH: normal mood. Good eye contact     Assessment & Plan:  Other mucopurulent conjunctivitis of both eyes  Upper respiratory tract infection, unspecified type  Other orders -     predniSONE; Take 2 tablets (40 mg total) by mouth daily with breakfast for 5 days.  Dispense: 10 tablet; Refill: 0 -     Ofloxacin; Place 1 drop into both eyes 4 (four) times daily.  Dispense: 5 mL; Refill: 0 -     Azithromycin; Take 2 tablets on day 1, then 1 tablet daily on days 2 through 5  Dispense: 6 tablet; Refill: 0   Conjunctivitis/upper respiratory infection (URI)-a lot of inflammation  ? Allergies severe?  More likely viral/bacterial.  Prednisone 40 mg daily, zpk, ofloxin ophth.     Angelena Sole, MD

## 2022-10-21 ENCOUNTER — Encounter: Payer: Self-pay | Admitting: Obstetrics & Gynecology

## 2022-10-21 ENCOUNTER — Ambulatory Visit: Payer: 59 | Admitting: Obstetrics & Gynecology

## 2022-10-21 ENCOUNTER — Ambulatory Visit (INDEPENDENT_AMBULATORY_CARE_PROVIDER_SITE_OTHER): Payer: 59

## 2022-10-21 VITALS — BP 120/80 | HR 80

## 2022-10-21 DIAGNOSIS — D252 Subserosal leiomyoma of uterus: Secondary | ICD-10-CM

## 2022-10-21 NOTE — Progress Notes (Signed)
    Christine Holland [facility] 01-31-1986 [id number]        37 y.o.  G0P0000   RP: F/U on Uterine Fibroids with Pelvic US  HPI: No significant change x last visit in 04/2022.  Menses regular normal every month.  Mild dysmenorrhea.  No BTB.  No abdominopelvic pain.  No desire to conceive for about 2 years.  Currently abstinent.  Pap test neg and Hb 11.7 on 04/05/22.  Will do Annual exam with Fam MD.   OB History  Gravida Para Term Preterm AB Living  0 0 0 0 0 0  SAB IAB Ectopic Multiple Live Births  0 0 0 0 0    Past medical history,surgical history, problem list, medications, allergies, family history and social history were all reviewed and documented in the EPIC chart.   Directed ROS with pertinent positives and negatives documented in the history of present illness/assessment and plan.  Exam:  Vitals:   10/21/22 0852  BP: 120/80  Pulse: 80  SpO2: 98%   General appearance:  Normal  Pelvic US today: T/A images.  Anteverted uterus enlarged with fibroids.  The overall uterine size is measured at 20.75 x 12.06 x 13.07 cm.  The largest fibroid is measured at 10.4 cm and it is left lateral pedunculated.  The endometrial lining is thin and symmetrical with no mass or thickening seen.  The endometrial lining is measured at 7.05 mm.  The views are limited secondary to large fibroids.  Both ovaries are normal.  No adnexal mass.  No free fluid in the pelvis.   Assessment/Plan:  37 y.o. G0P0000   1. Fibroids, subserous   No significant change x last visit in 04/2022.  Menses regular normal every month.  Mild dysmenorrhea.  No BTB.  No abdominopelvic pain.  No desire to conceive for about 2 years.  Currently abstinent.  Pap test neg and Hb 11.7 on 04/05/22.  Will do Annual exam with Fam MD.  Pelvic US thoroughly reviewed with patient.  Overall stable Uterine Fibroids and mild symptoms.  Will continue to observe at this time.  [name] MD, 9:04 AM 10/21/2022

## 2023-03-26 ENCOUNTER — Encounter (HOSPITAL_COMMUNITY): Payer: Self-pay

## 2023-03-26 ENCOUNTER — Ambulatory Visit (HOSPITAL_COMMUNITY)
Admission: RE | Admit: 2023-03-26 | Discharge: 2023-03-26 | Disposition: A | Discharge status: Home / Self Care | Payer: 59 | Source: Ambulatory Visit | Attending: Family Medicine | Admitting: Family Medicine

## 2023-03-26 ENCOUNTER — Ambulatory Visit (INDEPENDENT_AMBULATORY_CARE_PROVIDER_SITE_OTHER): Payer: 59

## 2023-03-26 VITALS — BP 124/81 | HR 87 | Temp 98.8°F | Resp 16 | Ht 69.0 in | Wt 218.0 lb

## 2023-03-26 DIAGNOSIS — M25511 Pain in right shoulder: Secondary | ICD-10-CM

## 2023-03-26 DIAGNOSIS — M545 Low back pain, unspecified: Secondary | ICD-10-CM | POA: Diagnosis not present

## 2023-03-26 MED ORDER — NAPROXEN 500 MG PO TABS: 500.0000 mg | ORAL_TABLET | Freq: Two times a day (BID) | ORAL | 0 refills | Status: DC | PRN

## 2023-03-26 MED ORDER — TIZANIDINE HCL 4 MG PO TABS: 4.0000 mg | ORAL_TABLET | Freq: Three times a day (TID) | ORAL | 0 refills | Status: DC | PRN

## 2023-03-26 NOTE — Discharge Instructions (Signed)
By my review, the shoulder x-ray does not show any broken bones.  The radiologist will also read your x-ray, and if their interpretation differs significantly from mine, we will call you.  Take naproxen 500 mg--1 tablet every 12 hours as needed for pain   Take tizanidine 4 mg--1 every 8 hours as needed for muscle spasms; this medication can cause dizziness and sleepiness  Please call the orthopedics specialists for an appointment to be further evaluated.  Also please follow-up with your primary care about this issues.
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
## 2023-04-07 ENCOUNTER — Ambulatory Visit: Payer: 59 | Admitting: Family Medicine

## 2023-04-07 ENCOUNTER — Encounter: Payer: Self-pay | Admitting: Family Medicine

## 2023-04-07 VITALS — BP 114/73 | HR 91 | Temp 98.2°F | Resp 18 | Ht 68.5 in | Wt 224.0 lb

## 2023-04-07 DIAGNOSIS — Z131 Encounter for screening for diabetes mellitus: Secondary | ICD-10-CM | POA: Diagnosis not present

## 2023-04-07 DIAGNOSIS — Z0000 Encounter for general adult medical examination without abnormal findings: Secondary | ICD-10-CM

## 2023-04-07 DIAGNOSIS — Z23 Encounter for immunization: Secondary | ICD-10-CM | POA: Diagnosis not present

## 2023-04-07 DIAGNOSIS — Z13220 Encounter for screening for lipoid disorders: Secondary | ICD-10-CM | POA: Diagnosis not present

## 2023-04-07 LAB — CBC WITH DIFFERENTIAL/PLATELET
Basophils Absolute: 0 10*3/uL (ref 0.0–0.1)
Basophils Relative: 0.6 % (ref 0.0–3.0)
Eosinophils Absolute: 0 10*3/uL (ref 0.0–0.7)
Eosinophils Relative: 1.5 % (ref 0.0–5.0)
HCT: 37.8 % (ref 36.0–46.0)
Hemoglobin: 12.1 g/dL (ref 12.0–15.0)
Lymphocytes Relative: 38.9 % (ref 12.0–46.0)
Lymphs Abs: 1.3 10*3/uL (ref 0.7–4.0)
MCHC: 31.9 g/dL (ref 30.0–36.0)
MCV: 79 fL (ref 78.0–100.0)
Monocytes Absolute: 0.4 10*3/uL (ref 0.1–1.0)
Monocytes Relative: 10.8 % (ref 3.0–12.0)
Neutro Abs: 1.6 10*3/uL (ref 1.4–7.7)
Neutrophils Relative %: 48.2 % (ref 43.0–77.0)
Platelets: 158 10*3/uL (ref 150.0–400.0)
RBC: 4.79 Mil/uL (ref 3.87–5.11)
RDW: 13.9 % (ref 11.5–15.5)
WBC: 3.4 10*3/uL — ABNORMAL LOW (ref 4.0–10.5)

## 2023-04-07 LAB — COMPREHENSIVE METABOLIC PANEL
ALT: 15 U/L (ref 0–35)
AST: 16 U/L (ref 0–37)
Albumin: 3.9 g/dL (ref 3.5–5.2)
Alkaline Phosphatase: 64 U/L (ref 39–117)
BUN: 13 mg/dL (ref 6–23)
CO2: 28 meq/L (ref 19–32)
Calcium: 9 mg/dL (ref 8.4–10.5)
Chloride: 105 meq/L (ref 96–112)
Creatinine, Ser: 0.84 mg/dL (ref 0.40–1.20)
GFR: 89.27 mL/min (ref >60.00)
Glucose, Bld: 95 mg/dL (ref 70–99)
Potassium: 4.1 meq/L (ref 3.5–5.1)
Sodium: 138 meq/L (ref 135–145)
Total Bilirubin: 0.5 mg/dL (ref 0.2–1.2)
Total Protein: 7.7 g/dL (ref 6.0–8.3)

## 2023-04-07 LAB — LIPID PANEL
Cholesterol: 163 mg/dL (ref 0–200)
HDL: 40.1 mg/dL (ref >39.00)
LDL Cholesterol: 109 mg/dL — ABNORMAL HIGH (ref 0–99)
NonHDL: 122.79
Total CHOL/HDL Ratio: 4
Triglycerides: 68 mg/dL (ref 0.0–149.0)
VLDL: 13.6 mg/dL (ref 0.0–40.0)

## 2023-04-07 LAB — TSH: TSH: 2.39 u[IU]/mL (ref 0.35–5.50)

## 2023-04-07 LAB — HEMOGLOBIN A1C: Hgb A1c MFr Bld: 6 % (ref 4.6–6.5)
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
## 2023-04-07 NOTE — Patient Instructions (Addendum)
It was very nice to see you today!  [facility] at [facility]  [address] on the 1st floor Phone number [phone]   Magnesium 200-400mg  daily Benefiber daily  Check insurance if they cover any weight loss meds.  Wegovy, zepbound   PLEASE NOTE:  If you had any lab tests please let us know if you have not heard back within a few days. You may see your results on MyChart before we have a chance to review them but we will give you a call once they are reviewed by [place]. If we ordered any referrals today, please let us know if you have not heard from their office within the next week.   Please try these tips to maintain a healthy lifestyle:  Eat most of your calories during the day when you are active. Eliminate processed foods including packaged sweets (pies, cakes, cookies), reduce intake of potatoes, white bread, white pasta, and white rice. Look for whole grain options, oat flour or almond flour.  Each meal should contain half fruits/vegetables, one quarter protein, and one quarter carbs (no bigger than a computer mouse).  Cut down on sweet beverages. This includes juice, soda, and sweet tea. Also watch fruit intake, though this is a healthier sweet option, it still contains natural sugar! Limit to 3 servings daily.  Drink at least 1 glass of water with each meal and aim for at least 8 glasses per day  Exercise at least 150 minutes every week.

## 2023-04-07 NOTE — Progress Notes (Signed)
Labs ok except: 1.  A1C(3 month average of sugars) is elevated.  This is considered PreDiabetes.  Work on diet-decrease sugars and starches and aim for 30 minutes of exercise 5 days/week to prevent progression to diabetes

## 2023-04-13 ENCOUNTER — Ambulatory Visit (INDEPENDENT_AMBULATORY_CARE_PROVIDER_SITE_OTHER): Payer: 59 | Admitting: Obstetrics and Gynecology

## 2023-04-13 ENCOUNTER — Telehealth: Payer: Self-pay | Admitting: *Deleted

## 2023-04-13 ENCOUNTER — Encounter: Payer: Self-pay | Admitting: Obstetrics and Gynecology

## 2023-04-13 VITALS — BP 112/68 | HR 77 | Ht 68.5 in | Wt 223.0 lb

## 2023-04-13 DIAGNOSIS — D219 Benign neoplasm of connective and other soft tissue, unspecified: Secondary | ICD-10-CM | POA: Diagnosis not present

## 2023-04-13 NOTE — Telephone Encounter (Signed)
Call placed to [facility] with patient on line. Spoke with Collete. Was advised order needs to be updated to MRI pelvis w/wo contrast.   Patient scheduled for MRI pelvis w/wo contrast scheduled on 04/16/23 at [facility] at 1300.   Business office notified.   Routing to provider for final review. Patient is agreeable to disposition. Will close encounter.

## 2023-04-13 NOTE — Assessment & Plan Note (Signed)
Reviewed ultrasound revealing: 20cm uterus with multiple leiomyoma measuring up to 10-12cm  Patient interested in fertility sparing surgery.  Discussed hormonal therapy for fibroid shrinking, however can only be used up to 2 years.  Abdominal myomectomy discussed as fertility sparing surgical management.  Recommend pelvic MRI for presurgical planning, including number of fibroids and consideration regarding number of hysterotomies. Discussed that myomectomy often limits mode of  delivery in future pregnancy to cesarean delivery only. Also discussed risk of hemorrhage and blood transfusion.  All questions answered. RTO for EMB following MRI.

## 2023-04-13 NOTE — Telephone Encounter (Signed)
-----   Message from Rosalyn Gess sent at 04/13/2023 11:42 AM EDT ----- MRI ordered. Please assist with scheduling.

## 2023-04-13 NOTE — Progress Notes (Signed)
    37 y.o. G0P0000 female with uterine leiomyoma here for follow-up.  Patient's last menstrual period was 04/04/2023 (exact date).   She desires fertility sparing surgery. 10/21/22 TVUS: Anteverted uterus enlarged with fibroids. The overall uterine size is measured at 20.75 x 12.06 x 13.07 cm. The largest fibroid is measured at 10.4 cm and it is left lateral pedunculated.  PAP 04/05/22 NIL, HPV neg  Birth control: none Sexually active: no, however in a relationship.  GYN HISTORY: No significant history  OB History  Gravida Para Term Preterm AB Living  0 0 0 0 0 0  SAB IAB Ectopic Multiple Live Births  0 0 0 0 0    Past Medical History:  Diagnosis Date   Anemia    Bilateral fibroadenomas of breasts    Fibroid    GERD (gastroesophageal reflux disease)    Leukocytopenia    s/p hematology consultation 2013.   Sickle cell trait [facility])     Past Surgical History:  Procedure Laterality Date   BREAST CYST EXCISION Bilateral 2006   BREAST SURGERY      No current outpatient medications on file prior to visit.   No current facility-administered medications on file prior to visit.    No Known Allergies    PE Today's Vitals   04/13/23 1004  BP: 112/68  Pulse: 77  SpO2: 100%  Weight: 223 lb (101.2 kg)  Height: 5' 8.5" (1.74 m)   Body mass index is 33.41 kg/m.  Physical Exam Vitals reviewed.  Constitutional:      General: She is not in acute distress.    Appearance: Normal appearance.  HENT:     Head: Normocephalic and atraumatic.     Nose: Nose normal.  Eyes:     Extraocular Movements: Extraocular movements intact.     Conjunctiva/sclera: Conjunctivae normal.  Pulmonary:     Effort: Pulmonary effort is normal.  Abdominal:     General: There is no distension.     Palpations: Abdomen is soft. There is mass.     Tenderness: There is no abdominal tenderness. There is no guarding.       Comments: Mass palpated 4cm above umbilicus.  Musculoskeletal:         General: Normal range of motion.     Cervical back: Normal range of motion.  Neurological:     General: No focal deficit present.     Mental Status: She is alert.  Psychiatric:        Mood and Affect: Mood normal.        Behavior: Behavior normal.       Assessment and Plan:        Fibroids Assessment & Plan: Reviewed ultrasound revealing: 20cm uterus with multiple leiomyoma measuring up to 10-12cm  Patient interested in fertility sparing surgery.  Discussed hormonal therapy for fibroid shrinking, however can only be used up to 2 years.  Abdominal myomectomy discussed as fertility sparing surgical management.  Recommend pelvic MRI for presurgical planning, including number of fibroids and consideration regarding number of hysterotomies. Discussed that myomectomy often limits mode of  delivery in future pregnancy to cesarean delivery only. Also discussed risk of hemorrhage and blood transfusion.  All questions answered. RTO for EMB following MRI.  Orders: -     MR PELVIS W CONTRAST; Future -     Ambulatory Referral For Surgery Scheduling -     Endometrial biopsy; Future  Rosalyn Gess, MD

## 2023-04-16 ENCOUNTER — Ambulatory Visit (HOSPITAL_COMMUNITY)
Admission: RE | Admit: 2023-04-16 | Discharge: 2023-04-16 | Disposition: A | Discharge status: Home / Self Care | Payer: 59 | Source: Ambulatory Visit | Attending: Obstetrics and Gynecology | Admitting: Obstetrics and Gynecology

## 2023-04-16 DIAGNOSIS — D219 Benign neoplasm of connective and other soft tissue, unspecified: Secondary | ICD-10-CM | POA: Diagnosis present

## 2023-04-16 MED ORDER — GADOBUTROL 1 MMOL/ML IV SOLN
10.0000 mL | Freq: Once | INTRAVENOUS | Status: AC | PRN
Start: 2023-04-16 — End: 2023-04-16
  Administered 2023-04-16: 10 mL via INTRAVENOUS

## 2023-04-25 ENCOUNTER — Encounter: Payer: Self-pay | Admitting: Obstetrics and Gynecology

## 2023-04-25 NOTE — Telephone Encounter (Signed)
Call placed to [facility] Radiology Reading Room, spoke with [facility]. Requested MRI results from 04/16/23 be expedited.

## 2023-04-28 ENCOUNTER — Telehealth: Payer: 59 | Admitting: Obstetrics and Gynecology

## 2023-04-28 ENCOUNTER — Encounter: Payer: Self-pay | Admitting: Obstetrics and Gynecology

## 2023-04-28 VITALS — Ht 68.0 in

## 2023-04-28 DIAGNOSIS — D219 Benign neoplasm of connective and other soft tissue, unspecified: Secondary | ICD-10-CM | POA: Diagnosis not present

## 2023-04-28 NOTE — Assessment & Plan Note (Addendum)
Patient interested in fertility sparing surgery. 04/16/23 MRI pelvis performed for preoperative planning revealed: 23cm uterus with at least 5 fibroids. Largest is a 15cm, peduculated fibroid. However there is also an 8cm anterior intramural fibroid.  Reviewed results with patient. Abdominal myomectomy previously discussed as fertility sparing surgical management. Wants to continue. RTO for EMB, also discussed the procedure today. Discussed that myomectomy often limits mode of  delivery in future pregnancy to cesarean delivery only. All questions answered.

## 2023-04-28 NOTE — Progress Notes (Signed)
    37 y.o. G0P0000 female with uterine leiomyoma here for results discussion via video visit. For this visit, the patient is in her home, and I am in my office at the [facility].   Patient has questions about MRI results and EMB procedure.  Patient's last menstrual period was 04/04/2023 (exact date).   She desires fertility sparing surgery. 10/21/22 TVUS: Anteverted uterus enlarged with fibroids. The overall uterine size is measured at 20.75 x 12.06 x 13.07 cm. The largest fibroid is measured at 10.4 cm and it is left lateral pedunculated.  PAP 04/05/22 NIL, HPV neg 04/16/23 MRI pelvis:   "Uterus: 22.7cm. An intramural fibroid is seen in the anterior corpus measuring 8.6 cm... Three intramural and subserosal fibroids are seen in the posterior uterine corpus and fundus which measure 4.7 cm, 3.1 cm, and 2.4 cm in maximum diameter.   - Large pedunculated fibroid is seen..., which measures 15.1 cm in maximum diameter. This shows a thick soft tissue pedicle of attachment to the uterus measuring 7.0 cm in thickness."  Birth control: none Sexually active: no, however in a relationship.  GYN HISTORY: No significant history  OB History  Gravida Para Term Preterm AB Living  0 0 0 0 0 0  SAB IAB Ectopic Multiple Live Births  0 0 0 0 0    Past Medical History:  Diagnosis Date   Anemia    Bilateral fibroadenomas of breasts    Fibroid    GERD (gastroesophageal reflux disease)    Leukocytopenia    s/p hematology consultation 2013.   Sickle cell trait [facility])     Past Surgical History:  Procedure Laterality Date   BREAST CYST EXCISION Bilateral 2006   BREAST SURGERY      No current outpatient medications on file prior to visit.   No current facility-administered medications on file prior to visit.    No Known Allergies    PE Today's Vitals   04/28/23 0903  Height: 5\' 8"  (1.727 m)    Body mass index is 33.91 kg/m.  Physical Exam Vitals reviewed.   Constitutional:      General: She is not in acute distress.    Appearance: Normal appearance.  HENT:     Head: Normocephalic and atraumatic.     Nose: Nose normal.  Eyes:     Extraocular Movements: Extraocular movements intact.  Pulmonary:     Effort: Pulmonary effort is normal.  Musculoskeletal:     Cervical back: Normal range of motion.  Neurological:     General: No focal deficit present.     Mental Status: She is alert.  Psychiatric:        Mood and Affect: Mood normal.        Behavior: Behavior normal.      Assessment and Plan:        Fibroids Assessment & Plan: Patient interested in fertility sparing surgery. 04/16/23 MRI pelvis performed for preoperative planning revealed: 23cm uterus with at least 5 fibroids. Largest is a 15cm, peduculated fibroid. However there is also an 8cm anterior intramural fibroid.  Reviewed results with patient. Abdominal myomectomy previously discussed as fertility sparing surgical management. Wants to continue. RTO for EMB, also discussed the procedure today. Discussed that myomectomy often limits mode of  delivery in future pregnancy to cesarean delivery only. All questions answered.     Rosalyn Gess, MD

## 2023-04-29 ENCOUNTER — Ambulatory Visit: Payer: 59 | Admitting: Obstetrics and Gynecology

## 2023-04-29 ENCOUNTER — Other Ambulatory Visit (HOSPITAL_COMMUNITY)
Admission: RE | Admit: 2023-04-29 | Discharge: 2023-04-29 | Disposition: A | Discharge status: Home / Self Care | Payer: 59 | Source: Ambulatory Visit | Attending: Obstetrics and Gynecology | Admitting: Obstetrics and Gynecology

## 2023-04-29 ENCOUNTER — Encounter: Payer: Self-pay | Admitting: Obstetrics and Gynecology

## 2023-04-29 VITALS — BP 122/74 | HR 90 | Wt 225.0 lb

## 2023-04-29 DIAGNOSIS — Z01812 Encounter for preprocedural laboratory examination: Secondary | ICD-10-CM

## 2023-04-29 DIAGNOSIS — D219 Benign neoplasm of connective and other soft tissue, unspecified: Secondary | ICD-10-CM | POA: Insufficient documentation

## 2023-04-29 LAB — PREGNANCY, URINE: Preg Test, Ur: NEGATIVE

## 2023-04-29 NOTE — Assessment & Plan Note (Signed)
Patient interested in fertility sparing surgery. 04/16/23 MRI pelvis performed for preoperative planning revealed: 23cm uterus with at least 5 fibroids. Largest is a 15cm, peduculated fibroid. However there is also an 8cm anterior intramural fibroid.  Reviewed results with patient. Abdominal myomectomy previously discussed as fertility sparing surgical management. Wants to continue. Consents signed. Uncomplicated EMB.  Surgery scheduled for 05/24/23.

## 2023-04-29 NOTE — Patient Instructions (Signed)
It is common to have vaginal bleeding and cramping for up to 72 hours after your biopsy. Please call our office with heavy vaginal bleeding, severe abdominal pain or fever. Avoid intercourse, tampon use, douching and baths for 7 days to decrease the risk of infection.

## 2023-04-29 NOTE — Progress Notes (Signed)
37 y.o. G0P0000 female with uterine leiomyoma here for EMB.  Patient's last menstrual period was 04/04/2023 (exact date).   She desires fertility sparing surgery. 10/21/22 TVUS: Anteverted uterus enlarged with fibroids. The overall uterine size is measured at 20.75 x 12.06 x 13.07 cm. The largest fibroid is measured at 10.4 cm and it is left lateral pedunculated.  PAP 04/05/22 NIL, HPV neg 04/16/23 MRI pelvis:   "Uterus: 22.7cm. An intramural fibroid is seen in the anterior corpus measuring 8.6 cm... Three intramural and subserosal fibroids are seen in the posterior uterine corpus and fundus which measure 4.7 cm, 3.1 cm, and 2.4 cm in maximum diameter.   - Large pedunculated fibroid is seen..., which measures 15.1 cm in maximum diameter. This shows a thick soft tissue pedicle of attachment to the uterus measuring 7.0 cm in thickness."  Birth control: none Sexually active: no, however in a relationship.  GYN HISTORY: No significant history  OB History  Gravida Para Term Preterm AB Living  0 0 0 0 0 0  SAB IAB Ectopic Multiple Live Births  0 0 0 0 0    Past Medical History:  Diagnosis Date   Anemia    Bilateral fibroadenomas of breasts    Fibroid    GERD (gastroesophageal reflux disease)    Leukocytopenia    s/p hematology consultation 2013.   Sickle cell trait [facility])     Past Surgical History:  Procedure Laterality Date   BREAST CYST EXCISION Bilateral 2006   BREAST SURGERY      No current outpatient medications on file prior to visit.   No current facility-administered medications on file prior to visit.    No Known Allergies    PE Today's Vitals   04/29/23 1125  BP: 122/74  Pulse: 90  SpO2: 100%  Weight: 225 lb (102.1 kg)   Body mass index is 34.21 kg/m.  Physical Exam Vitals reviewed. Exam conducted with a chaperone present.  Constitutional:      General: She is not in acute distress.    Appearance: Normal appearance.  HENT:     Head:  Normocephalic and atraumatic.     Nose: Nose normal.  Eyes:     Extraocular Movements: Extraocular movements intact.     Conjunctiva/sclera: Conjunctivae normal.  Pulmonary:     Effort: Pulmonary effort is normal.  Genitourinary:    General: Normal vulva.     Exam position: Lithotomy position.     Vagina: Normal. No vaginal discharge.     Cervix: Normal. No cervical motion tenderness, discharge or lesion.     Uterus: Normal. Not enlarged and not tender.      Adnexa: Right adnexa normal and left adnexa normal.  Musculoskeletal:        General: Normal range of motion.     Cervical back: Normal range of motion.  Neurological:     General: No focal deficit present.     Mental Status: She is alert.  Psychiatric:        Mood and Affect: Mood normal.        Behavior: Behavior normal.     Procedure Speculum inserted into the vagina, cervix visualized and was prepped with Betadine.  Cervical block was performed with 10cc 1% lidocaine (Lot#: [id number], Exp 02/2025). The 3 mm pipelle was introduced into the endometrial cavity without difficulty to a depth of 12cm, suction initiated and a moderate amount of tissue was obtained and sent to pathology.  The instruments were removed from  the patient's vagina.  Minimal bleeding from the cervix was noted.  The patient tolerated the procedure well.     Assessment and Plan:        Pre-procedure lab exam -     Pregnancy, urine -     Surgical pathology  Fibroids Assessment & Plan: Patient interested in fertility sparing surgery. 04/16/23 MRI pelvis performed for preoperative planning revealed: 23cm uterus with at least 5 fibroids. Largest is a 15cm, peduculated fibroid. However there is also an 8cm anterior intramural fibroid.  Reviewed results with patient. Abdominal myomectomy previously discussed as fertility sparing surgical management. Wants to continue. Consents signed. Uncomplicated EMB.  Surgery scheduled for  05/24/23.   Orders: -     Surgical pathology   Consents signed. Uncomplicated EMB.  Rosalyn Gess, MD

## 2023-05-03 ENCOUNTER — Encounter: Payer: Self-pay | Admitting: *Deleted

## 2023-05-03 LAB — SURGICAL PATHOLOGY

## 2023-05-16 ENCOUNTER — Ambulatory Visit: Payer: 59 | Admitting: Obstetrics and Gynecology

## 2023-05-16 ENCOUNTER — Encounter: Payer: Self-pay | Admitting: Obstetrics and Gynecology

## 2023-05-16 VITALS — BP 108/76 | HR 90 | Wt 227.0 lb

## 2023-05-16 DIAGNOSIS — Z01818 Encounter for other preprocedural examination: Secondary | ICD-10-CM | POA: Diagnosis not present

## 2023-05-16 DIAGNOSIS — D219 Benign neoplasm of connective and other soft tissue, unspecified: Secondary | ICD-10-CM

## 2023-05-16 MED ORDER — IBUPROFEN 800 MG PO TABS: 800.0000 mg | ORAL_TABLET | Freq: Three times a day (TID) | ORAL | 0 refills | Status: DC | PRN

## 2023-05-16 MED ORDER — OXYCODONE HCL 5 MG PO TABS: ORAL_TABLET | ORAL | 0 refills | Status: DC

## 2023-05-16 MED ORDER — ACETAMINOPHEN 500 MG PO TABS: 1000.0000 mg | ORAL_TABLET | Freq: Three times a day (TID) | ORAL | 0 refills | Status: DC | PRN

## 2023-05-16 NOTE — H&P (View-Only) (Signed)
 37 y.o. G0P0000 female with uterine leiomyoma here for preoperative visit. She presents with her mother.  Patient's last menstrual period was 05/03/2023 (approximate).   She desires fertility sparing surgery for management of fibroids. 10/21/22 TVUS: Anteverted uterus enlarged with fibroids. The overall uterine size is measured at 20.75 x 12.06 x 13.07 cm. The largest fibroid is measured at 10.4 cm and it is left lateral pedunculated.  PAP 04/05/22 NIL, HPV neg 04/16/23 MRI pelvis:   "Uterus: 22.7cm. An intramural fibroid is seen in the anterior corpus measuring 8.6 cm... Three intramural and subserosal fibroids are seen in the posterior uterine corpus and fundus which measure 4.7 cm, 3.1 cm, and 2.4 cm in maximum diameter.   - Large pedunculated fibroid is seen..., which measures 15.1 cm in maximum diameter. This shows a thick soft tissue pedicle of attachment to the uterus measuring 7.0 cm in thickness." 04/29/23 EMB benign  Birth control: none Sexually active: no, however in a relationship. No CP or SOB.  GYN HISTORY: No significant history  OB History  Gravida Para Term Preterm AB Living  0 0 0 0 0 0  SAB IAB Ectopic Multiple Live Births  0 0 0 0 0    Past Medical History:  Diagnosis Date   Anemia    Bilateral fibroadenomas of breasts    Fibroid    GERD (gastroesophageal reflux disease)    Leukocytopenia    s/p hematology consultation 2013.   Sickle cell trait [facility])     Past Surgical History:  Procedure Laterality Date   BREAST CYST EXCISION Bilateral 2006   BREAST SURGERY      No current outpatient medications on file prior to visit.   No current facility-administered medications on file prior to visit.    No Known Allergies    PE Today's Vitals   05/16/23 1212  BP: 108/76  Pulse: 90  SpO2: 100%  Weight: 227 lb (103 kg)    Body mass index is 34.52 kg/m.  Physical Exam Vitals reviewed.  Constitutional:      General: She is not in acute  distress.    Appearance: Normal appearance.  HENT:     Head: Normocephalic and atraumatic.     Nose: Nose normal.  Eyes:     Extraocular Movements: Extraocular movements intact.     Conjunctiva/sclera: Conjunctivae normal.  Cardiovascular:     Rate and Rhythm: Normal rate and regular rhythm.     Heart sounds: No murmur heard.    No friction rub. No gallop.  Pulmonary:     Effort: Pulmonary effort is normal. No respiratory distress.     Breath sounds: Normal breath sounds. No stridor. No wheezing, rhonchi or rales.  Musculoskeletal:        General: Normal range of motion.     Cervical back: Normal range of motion.  Neurological:     General: No focal deficit present.     Mental Status: She is alert.  Psychiatric:        Mood and Affect: Mood normal.        Behavior: Behavior normal.       Assessment and Plan:        Preop examination -     Ibuprofen; Take 1 tablet (800 mg total) by mouth every 8 (eight) hours as needed for up to 42 doses.  Dispense: 42 tablet; Refill: 0 -     Acetaminophen; Take 2 tablets (1,000 mg total) by mouth every 8 (eight) hours as needed.  Dispense: 90 tablet; Refill: 0 -     oxyCODONE HCl; On day 1, take 1 tablet by mouth every 4 hours as needed. On day 2, take 1 tablet by mouth every 6 hours as needed. On day 3, take 1 tablet by mouth every 8 hours as needed. On day 4, take 1 tablet by mouth every 12 hours as needed.  Dispense: 15 tablet; Refill: 0  Fibroids Assessment & Plan: Patient interested in fertility sparing surgery. 04/16/23 MRI pelvis performed for preoperative planning revealed: 23cm uterus with at least 5 fibroids. Largest is a 15cm, peduculated fibroid. However there is also an 8cm anterior intramural fibroid. 04/29/23 EMB benign Patient is currently scheduled for an abdominal myomectomy on 05/24/23.  Discussed ambulatory procedure via Pfannenstiel incision. Reviewed that recovery is usually 4-6 weeks. Risks including infections,  bleeding requiring blood transfusion, and damage to surrounding organs reviewed.  Discussed that myomectomy often limits mode of  delivery in future pregnancy to cesarean delivery only. Recommend contraception for 6 months following procedure for uterine healing.  Recommend NPO prior to midnight and reviewed medication to take on day of surgery. Dicussed use of NSAIDS and oxycodone as needed for pain postoperatively.  Preop checklist: Antibiotics: ancef DVT ppx: SCDs Postop visit: 2 week Additional clearance: none        Rosalyn Gess, MD

## 2023-05-16 NOTE — Progress Notes (Signed)
37 y.o. G0P0000 female with uterine leiomyoma here for preoperative visit. She presents with her mother.  Patient's last menstrual period was 05/03/2023 (approximate).   She desires fertility sparing surgery for management of fibroids. 10/21/22 TVUS: Anteverted uterus enlarged with fibroids. The overall uterine size is measured at 20.75 x 12.06 x 13.07 cm. The largest fibroid is measured at 10.4 cm and it is left lateral pedunculated.  PAP 04/05/22 NIL, HPV neg 04/16/23 MRI pelvis:   "Uterus: 22.7cm. An intramural fibroid is seen in the anterior corpus measuring 8.6 cm... Three intramural and subserosal fibroids are seen in the posterior uterine corpus and fundus which measure 4.7 cm, 3.1 cm, and 2.4 cm in maximum diameter.   - Large pedunculated fibroid is seen..., which measures 15.1 cm in maximum diameter. This shows a thick soft tissue pedicle of attachment to the uterus measuring 7.0 cm in thickness." 04/29/23 EMB benign  Birth control: none Sexually active: no, however in a relationship. No CP or SOB.  GYN HISTORY: No significant history  OB History  Gravida Para Term Preterm AB Living  0 0 0 0 0 0  SAB IAB Ectopic Multiple Live Births  0 0 0 0 0    Past Medical History:  Diagnosis Date   Anemia    Bilateral fibroadenomas of breasts    Fibroid    GERD (gastroesophageal reflux disease)    Leukocytopenia    s/p hematology consultation 2013.   Sickle cell trait [facility])     Past Surgical History:  Procedure Laterality Date   BREAST CYST EXCISION Bilateral 2006   BREAST SURGERY      No current outpatient medications on file prior to visit.   No current facility-administered medications on file prior to visit.    No Known Allergies    PE Today's Vitals   05/16/23 1212  BP: 108/76  Pulse: 90  SpO2: 100%  Weight: 227 lb (103 kg)    Body mass index is 34.52 kg/m.  Physical Exam Vitals reviewed.  Constitutional:      General: She is not in acute  distress.    Appearance: Normal appearance.  HENT:     Head: Normocephalic and atraumatic.     Nose: Nose normal.  Eyes:     Extraocular Movements: Extraocular movements intact.     Conjunctiva/sclera: Conjunctivae normal.  Cardiovascular:     Rate and Rhythm: Normal rate and regular rhythm.     Heart sounds: No murmur heard.    No friction rub. No gallop.  Pulmonary:     Effort: Pulmonary effort is normal. No respiratory distress.     Breath sounds: Normal breath sounds. No stridor. No wheezing, rhonchi or rales.  Musculoskeletal:        General: Normal range of motion.     Cervical back: Normal range of motion.  Neurological:     General: No focal deficit present.     Mental Status: She is alert.  Psychiatric:        Mood and Affect: Mood normal.        Behavior: Behavior normal.       Assessment and Plan:        Preop examination -     Ibuprofen; Take 1 tablet (800 mg total) by mouth every 8 (eight) hours as needed for up to 42 doses.  Dispense: 42 tablet; Refill: 0 -     Acetaminophen; Take 2 tablets (1,000 mg total) by mouth every 8 (eight) hours as needed.  Dispense: 90 tablet; Refill: 0 -     oxyCODONE HCl; On day 1, take 1 tablet by mouth every 4 hours as needed. On day 2, take 1 tablet by mouth every 6 hours as needed. On day 3, take 1 tablet by mouth every 8 hours as needed. On day 4, take 1 tablet by mouth every 12 hours as needed.  Dispense: 15 tablet; Refill: 0  Fibroids Assessment & Plan: Patient interested in fertility sparing surgery. 04/16/23 MRI pelvis performed for preoperative planning revealed: 23cm uterus with at least 5 fibroids. Largest is a 15cm, peduculated fibroid. However there is also an 8cm anterior intramural fibroid. 04/29/23 EMB benign Patient is currently scheduled for an abdominal myomectomy on 05/24/23.  Discussed ambulatory procedure via Pfannenstiel incision. Reviewed that recovery is usually 4-6 weeks. Risks including infections,  bleeding requiring blood transfusion, and damage to surrounding organs reviewed.  Discussed that myomectomy often limits mode of  delivery in future pregnancy to cesarean delivery only. Recommend contraception for 6 months following procedure for uterine healing.  Recommend NPO prior to midnight and reviewed medication to take on day of surgery. Dicussed use of NSAIDS and oxycodone as needed for pain postoperatively.  Preop checklist: Antibiotics: ancef DVT ppx: SCDs Postop visit: 2 week Additional clearance: none        Rosalyn Gess, MD

## 2023-05-16 NOTE — Assessment & Plan Note (Addendum)
Patient interested in fertility sparing surgery. 04/16/23 MRI pelvis performed for preoperative planning revealed: 23cm uterus with at least 5 fibroids. Largest is a 15cm, peduculated fibroid. However there is also an 8cm anterior intramural fibroid. 04/29/23 EMB benign Patient is currently scheduled for an abdominal myomectomy on 05/24/23.  Discussed ambulatory procedure via Pfannenstiel incision. Reviewed that recovery is usually 4-6 weeks. Risks including infections, bleeding requiring blood transfusion, and damage to surrounding organs reviewed.  Discussed that myomectomy often limits mode of  delivery in future pregnancy to cesarean delivery only. Recommend contraception for 6 months following procedure for uterine healing.  Recommend NPO prior to midnight and reviewed medication to take on day of surgery. Dicussed use of NSAIDS and oxycodone as needed for pain postoperatively.  Preop checklist: Antibiotics: ancef DVT ppx: SCDs Postop visit: 2 week Additional clearance: none

## 2023-05-17 ENCOUNTER — Encounter (HOSPITAL_BASED_OUTPATIENT_CLINIC_OR_DEPARTMENT_OTHER): Payer: Self-pay | Admitting: Obstetrics and Gynecology

## 2023-05-18 ENCOUNTER — Encounter (HOSPITAL_BASED_OUTPATIENT_CLINIC_OR_DEPARTMENT_OTHER): Payer: Self-pay | Admitting: Obstetrics and Gynecology

## 2023-05-19 ENCOUNTER — Encounter (HOSPITAL_BASED_OUTPATIENT_CLINIC_OR_DEPARTMENT_OTHER): Payer: Self-pay | Admitting: Obstetrics and Gynecology

## 2023-05-19 NOTE — Progress Notes (Signed)
Spoke w/ via phone for pre-op interview--- pt Lab needs dos----    urine preg     Lab results------  pt has lab appointment 05/23/2023 @ 0900 getting cbc/ t&s COVID test -----patient states asymptomatic no test needed Arrive at -------  0530 on 05-24-2023 NPO after MN NO Solid Food.  Clear liquids from MN until--- 0430 Med rec completed Medications to take morning of surgery ----- none Diabetic medication ----- n/a Patient instructed no nail polish to be worn day of surgery Patient instructed to bring photo id and insurance card day of surgery Patient aware to have Driver (ride ) / caregiver    for 24 hours after surgery -  mother,  marte Patient Special Instructions ----- pt picking bag with hibiclens soap and written instructions at lab appointment.  Reviewed RCC and visitor instructions. Pre-Op special Instructions -----  n/a Patient verbalized understanding of instructions that were given at this phone interview. Patient denies chest pain, sob, fever, cough at the interview.

## 2023-05-19 NOTE — Progress Notes (Signed)
Your procedure is scheduled on :  Tuesday, 05-24-2023  Report to [facility] AT  __5:30_ AM.   Call this number if you have problems the morning of surgery  :[phone]. Any questions prior to surgery call pre-op nurse, Philbert Ocallaghan :  [phone]   OUR ADDRESS IS [address].  WE ARE LOCATED IN [facility]  PLEASE BRING YOUR INSURANCE CARD AND PHOTO ID DAY OF SURGERY.                                     REMEMBER:  Do not eat food after after midnight night before surgery.  You may of clear liquid diet from midnight night before surgery up until 4:30 AM.  No clear liquids after 4:30 AM day of surgery.  This includes no water,  candy/ gum/ mints.   Please brush your teeth morning surgery and rinse your mouth out.   CLEAR LIQUID DIET  Allowed      Water                                                                   Coffee and tea, regular and decaf  (NO cream or milk products of any type, may sweeten, no honey)                         Carbonated beverages, regular and diet                                    Sports drinks like Gatorade _____________________________________________________________________     TAKE ONLY THESE MEDICATIONS MORNING OF SURGERY:   NONE                                        DO NOT WEAR JEWERLY/  METAL/  PIERCINGS (INCLUDING NO PLASTIC PIERCINGS) DO NOT WEAR LOTIONS, POWDERS, PERFUMES OR NAIL POLISH ON YOUR FINGERNAILS. TOENAIL POLISH IS OK TO WEAR. DO NOT SHAVE FOR 48 HOURS PRIOR TO DAY OF SURGERY.  CONTACTS, GLASSES, OR DENTURES MAY NOT BE WORN TO SURGERY.  REMEMBER: NO SMOKING, VAPING ,  DRUGS OR ALCOHOL FOR 24 HOURS BEFORE YOUR SURGERY.                                    [facility] IS NOT RESPONSIBLE  FOR ANY BELONGINGS.                                                                    Marland Kitchen           [facility] Preparing for [facility] Before surgery, you can play an  important role.  Because  skin is not sterile, your skin needs to be as free of germs as possible.  You can reduce the number of germs on your skin by washing with CHG (chlorahexidine gluconate) soap before surgery.  CHG is an antiseptic cleaner which kills germs and bonds with the skin to continue killing germs even after washing. Please DO NOT use if you have an allergy to CHG or antibacterial soaps.  If your skin becomes reddened/irritated stop using the CHG and inform your nurse when you arrive at Short Stay. Do not shave (including legs and underarms) for at least 48 hours prior to the first CHG shower.  You may shave your face/neck. Please follow these instructions carefully:  1.  Shower with CHG Soap the night before surgery and the  morning of Surgery.  2.  If you choose to wash your hair, wash your hair first as usual with your  normal  shampoo.  3.  After you shampoo, rinse your hair and body thoroughly to remove the  shampoo.                                        4.  Use CHG as you would any other liquid soap.  You can apply chg directly  to the skin and wash , chg soap provided, night before and morning of your surgery.  5.  Apply the CHG Soap to your body ONLY FROM THE NECK DOWN.   Do not use on face/ open                           Wound or open sores. Avoid contact with eyes, ears mouth and genitals (private parts).                       Wash face,  Genitals (private parts) with your normal soap.             6.  Wash thoroughly, paying special attention to the area where your surgery  will be performed.  7.  Thoroughly rinse your body with warm water from the neck down.  8.  DO NOT shower/wash with your normal soap after using and rinsing off  the CHG Soap.             9.  Pat yourself dry with a clean towel.            10.  Wear clean pajamas.            11.  Place clean sheets on your bed the night of your first shower and do not  sleep with pets. Day of Surgery : Do not apply any lotions/ powders the  morning of surgery.  Please wear clean clothes to the hospital/surgery center.  IF YOU HAVE ANY SKIN IRRITATION OR PROBLEMS WITH THE SURGICAL SOAP, PLEASE GET A BAR OF GOLD DIAL SOAP AND SHOWER THE NIGHT BEFORE YOUR SURGERY AND THE MORNING OF YOUR SURGERY. PLEASE LET THE NURSE KNOW MORNING OF YOUR SURGERY IF YOU HAD ANY PROBLEMS WITH THE SURGICAL SOAP.   YOUR SURGEON MAY HAVE REQUESTED EXTENDED RECOVERY TIME AFTER YOUR SURGERY. IT COULD BE A  JUST A FEW HOURS  UP TO AN OVERNIGHT STAY.  YOUR SURGEON SHOULD HAVE DISCUSSED THIS WITH YOU PRIOR TO YOUR SURGERY. IN THE EVENT YOU  NEED TO STAY OVERNIGHT PLEASE REFER TO THE FOLLOWING GUIDELINES. YOU MAY HAVE UP TO 4 VISITORS  MAY VISIT IN THE EXTENDED RECOVERY ROOM UNTIL 800 PM ONLY.  ONE  VISITOR AGE 46 AND OVER MAY SPEND THE NIGHT AND MUST BE IN EXTENDED RECOVERY ROOM NO LATER THAN 800 PM . YOUR DISCHARGE TIME AFTER YOU SPEND THE NIGHT IS 900 AM THE MORNING AFTER YOUR SURGERY. YOU MAY PACK A SMALL OVERNIGHT BAG WITH TOILETRIES FOR YOUR OVERNIGHT STAY IF YOU WISH.  REGARDLESS OF IF YOU STAY OVER NIGHT OR ARE DISCHARGED THE SAME DAY YOU WILL BE REQUIRED TO HAVE A RESPONSIBLE ADULT (18 YRS OLD OR OLDER) STAY WITH YOU FOR AT LEAST THE FIRST 24 HOURS  YOUR PRESCRIPTION MEDICATIONS WILL BE PROVIDED DURING [facility] STAY.  ________________________________________________________________________

## 2023-05-23 ENCOUNTER — Encounter (HOSPITAL_COMMUNITY)
Admission: RE | Admit: 2023-05-23 | Discharge: 2023-05-23 | Disposition: A | Discharge status: Home / Self Care | Payer: 59 | Source: Ambulatory Visit | Attending: Obstetrics and Gynecology | Admitting: Obstetrics and Gynecology

## 2023-05-23 DIAGNOSIS — D219 Benign neoplasm of connective and other soft tissue, unspecified: Secondary | ICD-10-CM | POA: Diagnosis not present

## 2023-05-23 DIAGNOSIS — Z01812 Encounter for preprocedural laboratory examination: Secondary | ICD-10-CM | POA: Diagnosis present

## 2023-05-23 LAB — CBC
HCT: 33.9 % — ABNORMAL LOW (ref 36.0–46.0)
Hemoglobin: 11 g/dL — ABNORMAL LOW (ref 12.0–15.0)
MCH: 25.7 pg — ABNORMAL LOW (ref 26.0–34.0)
MCHC: 32.4 g/dL (ref 30.0–36.0)
MCV: 79.2 fL — ABNORMAL LOW (ref 80.0–100.0)
Platelets: 157 10*3/uL (ref 150–400)
RBC: 4.28 MIL/uL (ref 3.87–5.11)
RDW: 13.7 % (ref 11.5–15.5)
WBC: 4.3 10*3/uL (ref 4.0–10.5)
nRBC: 0 % (ref 0.0–0.2)

## 2023-05-23 NOTE — Anesthesia Preprocedure Evaluation (Signed)
Anesthesia Evaluation  Patient identified by MRN, date of birth, ID band Patient awake    Reviewed: Allergy & Precautions, NPO status , Patient's Chart, lab work & pertinent test results  Airway Mallampati: II  TM Distance: >3 FB Neck ROM: Full    Dental no notable dental hx. (+) Teeth Intact, Dental Advisory Given   Pulmonary neg pulmonary ROS   Pulmonary exam normal breath sounds clear to auscultation       Cardiovascular Normal cardiovascular exam Rhythm:Regular Rate:Normal     Neuro/Psych negative neurological ROS     GI/Hepatic ,GERD  ,,  Endo/Other    Renal/GU negative Renal ROSLab Results      Component                Value               Date                            K                        4.1                 04/07/2023                        CREATININE               0.84                04/07/2023                       Uterine leiyomyomata    Musculoskeletal negative musculoskeletal ROS (+)    Abdominal  (+) + obese (BMI 33.71)  Peds  Hematology Lab Results      Component                Value               Date                      WBC                      4.3                 05/23/2023                HGB                      11.0 (L)            05/23/2023                HCT                      33.9 (L)            05/23/2023                MCV                      79.2 (L)            05/23/2023                PLT  157                 05/23/2023              Anesthesia Other Findings   Reproductive/Obstetrics negative OB ROS                              Anesthesia Physical Anesthesia Plan  ASA: 3  Anesthesia Plan: General   Post-op Pain Management: Tylenol PO (pre-op)*, Toradol IV (intra-op)* and Precedex   Induction: Intravenous  PONV Risk Score and Plan: 4 or greater and 3 and Treatment may vary due to age or medical condition, Midazolam,  Ondansetron and Propofol infusion  Airway Management Planned: Oral ETT  Additional Equipment: None  Intra-op Plan:   Post-operative Plan: Extubation in OR  Informed Consent: I have reviewed the patients History and Physical, chart, labs and discussed the procedure including the risks, benefits and alternatives for the proposed anesthesia with the patient or authorized representative who has indicated his/her understanding and acceptance.     Dental advisory given  Plan Discussed with: CRNA and Anesthesiologist  Anesthesia Plan Comments:          Anesthesia Quick Evaluation

## 2023-05-24 ENCOUNTER — Encounter (HOSPITAL_BASED_OUTPATIENT_CLINIC_OR_DEPARTMENT_OTHER): Payer: Self-pay | Admitting: Obstetrics and Gynecology

## 2023-05-24 ENCOUNTER — Observation Stay (HOSPITAL_BASED_OUTPATIENT_CLINIC_OR_DEPARTMENT_OTHER)
Admission: RE | Admit: 2023-05-24 | Discharge: 2023-05-25 | Disposition: A | Discharge status: Home / Self Care | Payer: 59 | Attending: Obstetrics and Gynecology | Admitting: Obstetrics and Gynecology

## 2023-05-24 ENCOUNTER — Other Ambulatory Visit: Payer: Self-pay

## 2023-05-24 ENCOUNTER — Ambulatory Visit (HOSPITAL_BASED_OUTPATIENT_CLINIC_OR_DEPARTMENT_OTHER): Payer: Self-pay | Admitting: Anesthesiology

## 2023-05-24 ENCOUNTER — Ambulatory Visit (HOSPITAL_BASED_OUTPATIENT_CLINIC_OR_DEPARTMENT_OTHER): Payer: 59 | Admitting: Anesthesiology

## 2023-05-24 ENCOUNTER — Encounter (HOSPITAL_COMMUNITY)
Admission: RE | Disposition: A | Discharge status: Home / Self Care | Payer: Self-pay | Source: Home / Self Care | Attending: Obstetrics and Gynecology

## 2023-05-24 DIAGNOSIS — D259 Leiomyoma of uterus, unspecified: Secondary | ICD-10-CM | POA: Diagnosis not present

## 2023-05-24 DIAGNOSIS — Z01818 Encounter for other preprocedural examination: Secondary | ICD-10-CM

## 2023-05-24 DIAGNOSIS — D219 Benign neoplasm of connective and other soft tissue, unspecified: Principal | ICD-10-CM

## 2023-05-24 DIAGNOSIS — D649 Anemia, unspecified: Secondary | ICD-10-CM | POA: Insufficient documentation

## 2023-05-24 HISTORY — DX: Prediabetes: R73.03

## 2023-05-24 HISTORY — DX: Leiomyoma of uterus, unspecified: D25.9

## 2023-05-24 HISTORY — DX: Presence of spectacles and contact lenses: Z97.3

## 2023-05-24 HISTORY — PX: MYOMECTOMY: SHX85

## 2023-05-24 LAB — BASIC METABOLIC PANEL
Anion gap: 9 (ref 5–15)
BUN: 12 mg/dL (ref 6–20)
CO2: 21 mmol/L — ABNORMAL LOW (ref 22–32)
Calcium: 8.2 mg/dL — ABNORMAL LOW (ref 8.9–10.3)
Chloride: 102 mmol/L (ref 98–111)
Creatinine, Ser: 0.85 mg/dL (ref 0.44–1.00)
GFR, Estimated: >60 mL/min (ref >60)
Glucose, Bld: 129 mg/dL — ABNORMAL HIGH (ref 70–99)
Potassium: 3.9 mmol/L (ref 3.5–5.1)
Sodium: 132 mmol/L — ABNORMAL LOW (ref 135–145)

## 2023-05-24 LAB — CBC
HCT: 29.9 % — ABNORMAL LOW (ref 36.0–46.0)
Hemoglobin: 9.8 g/dL — ABNORMAL LOW (ref 12.0–15.0)
MCH: 25.5 pg — ABNORMAL LOW (ref 26.0–34.0)
MCHC: 32.8 g/dL (ref 30.0–36.0)
MCV: 77.9 fL — ABNORMAL LOW (ref 80.0–100.0)
Platelets: 159 10*3/uL (ref 150–400)
RBC: 3.84 MIL/uL — ABNORMAL LOW (ref 3.87–5.11)
RDW: 13.4 % (ref 11.5–15.5)
WBC: 8 10*3/uL (ref 4.0–10.5)
nRBC: 0 % (ref 0.0–0.2)

## 2023-05-24 LAB — ABO/RH: ABO/RH(D): O POS

## 2023-05-24 LAB — POCT PREGNANCY, URINE: Preg Test, Ur: NEGATIVE

## 2023-05-24 LAB — PREPARE RBC (CROSSMATCH)

## 2023-05-24 SURGERY — MYOMECTOMY, ABDOMINAL APPROACH
Anesthesia: General | Site: Abdomen

## 2023-05-24 MED ORDER — VASOPRESSIN 20 UNIT/ML IV SOLN
INTRAVENOUS | Status: DC | PRN
  Administered 2023-05-24: 32 mL via INTRAMUSCULAR

## 2023-05-24 MED ORDER — DROPERIDOL 2.5 MG/ML IJ SOLN: 0.6250 mg | Freq: Once | INTRAMUSCULAR | Status: DC | PRN

## 2023-05-24 MED ORDER — MIDAZOLAM HCL 5 MG/5ML IJ SOLN
INTRAMUSCULAR | Status: DC | PRN
  Administered 2023-05-24: 2 mg via INTRAVENOUS

## 2023-05-24 MED ORDER — 0.9 % SODIUM CHLORIDE (POUR BTL) OPTIME
TOPICAL | Status: DC | PRN
  Administered 2023-05-24 (×2): 1000 mL

## 2023-05-24 MED ORDER — LACTATED RINGERS IV SOLN: INTRAVENOUS | Status: DC

## 2023-05-24 MED ORDER — PROPOFOL 10 MG/ML IV BOLUS
INTRAVENOUS | Status: DC | PRN
  Administered 2023-05-24: 200 mg via INTRAVENOUS

## 2023-05-24 MED ORDER — SENNA 8.6 MG PO TABS
1.0000 | ORAL_TABLET | Freq: Two times a day (BID) | ORAL | Status: DC
  Administered 2023-05-24 – 2023-05-25 (×2): 8.6 mg via ORAL
  Filled 2023-05-24 (×2): qty 1

## 2023-05-24 MED ORDER — ONDANSETRON HCL 4 MG/2ML IJ SOLN
INTRAMUSCULAR | Status: AC
  Filled 2023-05-24: qty 2

## 2023-05-24 MED ORDER — KETOROLAC TROMETHAMINE 30 MG/ML IJ SOLN: 30.0000 mg | Freq: Once | INTRAMUSCULAR | Status: DC | PRN

## 2023-05-24 MED ORDER — CEFAZOLIN SODIUM-DEXTROSE 2-4 GM/100ML-% IV SOLN
INTRAVENOUS | Status: AC
Start: 2023-05-24 — End: ?
  Filled 2023-05-24: qty 100

## 2023-05-24 MED ORDER — ONDANSETRON HCL 4 MG/2ML IJ SOLN: 4.0000 mg | Freq: Four times a day (QID) | INTRAMUSCULAR | Status: DC | PRN

## 2023-05-24 MED ORDER — CELECOXIB 200 MG PO CAPS
ORAL_CAPSULE | ORAL | Status: AC
  Filled 2023-05-24: qty 2

## 2023-05-24 MED ORDER — POVIDONE-IODINE 10 % EX SWAB: 2.0000 | Freq: Once | CUTANEOUS | Status: DC

## 2023-05-24 MED ORDER — MIDAZOLAM HCL 2 MG/2ML IJ SOLN
INTRAMUSCULAR | Status: AC
  Filled 2023-05-24: qty 2

## 2023-05-24 MED ORDER — DEXAMETHASONE SODIUM PHOSPHATE 10 MG/ML IJ SOLN
INTRAMUSCULAR | Status: AC
Start: 2023-05-24 — End: ?
  Filled 2023-05-24: qty 1

## 2023-05-24 MED ORDER — ACETAMINOPHEN 500 MG PO TABS
1000.0000 mg | ORAL_TABLET | ORAL | Status: AC
  Administered 2023-05-24: 1000 mg via ORAL

## 2023-05-24 MED ORDER — TRANEXAMIC ACID-NACL 1000-0.7 MG/100ML-% IV SOLN
INTRAVENOUS | Status: DC | PRN
  Administered 2023-05-24: 1000 mg via INTRAVENOUS

## 2023-05-24 MED ORDER — ACETAMINOPHEN 500 MG PO TABS
ORAL_TABLET | ORAL | Status: AC
  Filled 2023-05-24: qty 2

## 2023-05-24 MED ORDER — KETOROLAC TROMETHAMINE 30 MG/ML IJ SOLN
30.0000 mg | Freq: Four times a day (QID) | INTRAMUSCULAR | Status: AC
  Administered 2023-05-24 – 2023-05-25 (×4): 30 mg via INTRAVENOUS
  Filled 2023-05-24 (×4): qty 1

## 2023-05-24 MED ORDER — HYDROMORPHONE HCL 1 MG/ML IJ SOLN: 0.2000 mg | INTRAMUSCULAR | Status: DC | PRN

## 2023-05-24 MED ORDER — ENOXAPARIN SODIUM 40 MG/0.4ML IJ SOSY
40.0000 mg | PREFILLED_SYRINGE | INTRAMUSCULAR | Status: DC
  Administered 2023-05-25: 40 mg via SUBCUTANEOUS
  Filled 2023-05-24: qty 0.4

## 2023-05-24 MED ORDER — ONDANSETRON HCL 4 MG PO TABS: 4.0000 mg | ORAL_TABLET | Freq: Four times a day (QID) | ORAL | Status: DC | PRN

## 2023-05-24 MED ORDER — TRANEXAMIC ACID-NACL 1000-0.7 MG/100ML-% IV SOLN
INTRAVENOUS | Status: AC
  Filled 2023-05-24: qty 100

## 2023-05-24 MED ORDER — FENTANYL CITRATE (PF) 100 MCG/2ML IJ SOLN
INTRAMUSCULAR | Status: DC | PRN
  Administered 2023-05-24: 100 ug via INTRAVENOUS
  Administered 2023-05-24 (×2): 50 ug via INTRAVENOUS

## 2023-05-24 MED ORDER — OXYCODONE HCL 5 MG/5ML PO SOLN: 5.0000 mg | Freq: Once | ORAL | Status: DC | PRN

## 2023-05-24 MED ORDER — KETOROLAC TROMETHAMINE 30 MG/ML IJ SOLN
INTRAMUSCULAR | Status: AC
Start: 2023-05-24 — End: ?
  Filled 2023-05-24: qty 1

## 2023-05-24 MED ORDER — SUGAMMADEX SODIUM 200 MG/2ML IV SOLN
INTRAVENOUS | Status: DC | PRN
  Administered 2023-05-24: 200 mg via INTRAVENOUS

## 2023-05-24 MED ORDER — MISOPROSTOL 200 MCG PO TABS
400.0000 ug | ORAL_TABLET | Freq: Once | ORAL | Status: AC
  Administered 2023-05-24: 400 ug via VAGINAL

## 2023-05-24 MED ORDER — OXYCODONE HCL 5 MG PO TABS
5.0000 mg | ORAL_TABLET | ORAL | Status: DC | PRN
  Administered 2023-05-24: 5 mg via ORAL
  Filled 2023-05-24: qty 1

## 2023-05-24 MED ORDER — SIMETHICONE 80 MG PO CHEW: 80.0000 mg | CHEWABLE_TABLET | Freq: Four times a day (QID) | ORAL | Status: DC | PRN

## 2023-05-24 MED ORDER — ARTIFICIAL TEARS OPHTHALMIC OINT
TOPICAL_OINTMENT | OPHTHALMIC | Status: AC
Start: 2023-05-24 — End: ?
  Filled 2023-05-24: qty 3.5

## 2023-05-24 MED ORDER — DEXMEDETOMIDINE HCL IN NACL 80 MCG/20ML IV SOLN
INTRAVENOUS | Status: AC
  Filled 2023-05-24: qty 20

## 2023-05-24 MED ORDER — CEFAZOLIN SODIUM-DEXTROSE 2-4 GM/100ML-% IV SOLN
2.0000 g | INTRAVENOUS | Status: AC
  Administered 2023-05-24: 2 g via INTRAVENOUS

## 2023-05-24 MED ORDER — FENTANYL CITRATE (PF) 100 MCG/2ML IJ SOLN
INTRAMUSCULAR | Status: AC
  Filled 2023-05-24: qty 2

## 2023-05-24 MED ORDER — BUPIVACAINE HCL (PF) 0.5 % IJ SOLN
INTRAMUSCULAR | Status: DC | PRN
  Administered 2023-05-24: 20 mL

## 2023-05-24 MED ORDER — HYDROMORPHONE HCL 1 MG/ML IJ SOLN
0.2500 mg | INTRAMUSCULAR | Status: DC | PRN
  Administered 2023-05-24 (×2): 0.5 mg via INTRAVENOUS

## 2023-05-24 MED ORDER — CELECOXIB 200 MG PO CAPS
400.0000 mg | ORAL_CAPSULE | ORAL | Status: AC
  Administered 2023-05-24: 400 mg via ORAL

## 2023-05-24 MED ORDER — ONDANSETRON HCL 4 MG/2ML IJ SOLN: 4.0000 mg | Freq: Once | INTRAMUSCULAR | Status: DC | PRN

## 2023-05-24 MED ORDER — HYDROMORPHONE HCL 1 MG/ML IJ SOLN
INTRAMUSCULAR | Status: AC
  Filled 2023-05-24: qty 1

## 2023-05-24 MED ORDER — IBUPROFEN 400 MG PO TABS
600.0000 mg | ORAL_TABLET | Freq: Four times a day (QID) | ORAL | Status: DC
  Administered 2023-05-25: 600 mg via ORAL
  Filled 2023-05-24: qty 1

## 2023-05-24 MED ORDER — DEXMEDETOMIDINE HCL IN NACL 80 MCG/20ML IV SOLN
INTRAVENOUS | Status: DC | PRN
  Administered 2023-05-24: 12 ug via INTRAVENOUS

## 2023-05-24 MED ORDER — ACETAMINOPHEN 500 MG PO TABS
1000.0000 mg | ORAL_TABLET | Freq: Four times a day (QID) | ORAL | Status: DC
  Administered 2023-05-24 – 2023-05-25 (×5): 1000 mg via ORAL
  Filled 2023-05-24 (×5): qty 2

## 2023-05-24 MED ORDER — PROPOFOL 10 MG/ML IV BOLUS
INTRAVENOUS | Status: AC
  Filled 2023-05-24: qty 20

## 2023-05-24 MED ORDER — LIDOCAINE 2% (20 MG/ML) 5 ML SYRINGE
INTRAMUSCULAR | Status: DC | PRN
  Administered 2023-05-24: 100 mg via INTRAVENOUS

## 2023-05-24 MED ORDER — PHENYLEPHRINE HCL (PRESSORS) 10 MG/ML IV SOLN
INTRAVENOUS | Status: DC | PRN
  Administered 2023-05-24 (×2): 80 ug via INTRAVENOUS

## 2023-05-24 MED ORDER — ROCURONIUM BROMIDE 10 MG/ML (PF) SYRINGE
PREFILLED_SYRINGE | INTRAVENOUS | Status: AC
  Filled 2023-05-24: qty 10

## 2023-05-24 MED ORDER — ONDANSETRON HCL 4 MG/2ML IJ SOLN
INTRAMUSCULAR | Status: DC | PRN
  Administered 2023-05-24: 4 mg via INTRAVENOUS

## 2023-05-24 MED ORDER — ROCURONIUM BROMIDE 10 MG/ML (PF) SYRINGE
PREFILLED_SYRINGE | INTRAVENOUS | Status: DC | PRN
  Administered 2023-05-24: 10 mg via INTRAVENOUS
  Administered 2023-05-24: 60 mg via INTRAVENOUS

## 2023-05-24 MED ORDER — PHENYLEPHRINE 80 MCG/ML (10ML) SYRINGE FOR IV PUSH (FOR BLOOD PRESSURE SUPPORT)
PREFILLED_SYRINGE | INTRAVENOUS | Status: AC
  Filled 2023-05-24: qty 20

## 2023-05-24 MED ORDER — OXYCODONE HCL 5 MG PO TABS: 5.0000 mg | ORAL_TABLET | Freq: Once | ORAL | Status: DC | PRN

## 2023-05-24 MED ORDER — POLYETHYLENE GLYCOL 3350 17 G PO PACK: 17.0000 g | PACK | Freq: Every day | ORAL | Status: DC | PRN

## 2023-05-24 MED ORDER — LIDOCAINE HCL (PF) 2 % IJ SOLN
INTRAMUSCULAR | Status: AC
  Filled 2023-05-24: qty 5

## 2023-05-24 MED ORDER — DEXAMETHASONE SODIUM PHOSPHATE 10 MG/ML IJ SOLN
INTRAMUSCULAR | Status: DC | PRN
  Administered 2023-05-24: 10 mg via INTRAVENOUS

## 2023-05-24 SURGICAL SUPPLY — 25 items
CHLORAPREP W/TINT 26 (MISCELLANEOUS) ×1 IMPLANT
DERMABOND ADVANCED .7 DNX12 (GAUZE/BANDAGES/DRESSINGS) ×1 IMPLANT
DRAPE CESAREAN BIRTH W POUCH (DRAPES) ×1 IMPLANT
DRAPE WARM FLUID 44X44 (DRAPES) ×1 IMPLANT
GAUZE 4X4 16PLY ~~LOC~~+RFID DBL (SPONGE) ×2 IMPLANT
GLOVE BIO SURGEON STRL SZ7.5 (GLOVE) ×2 IMPLANT
GLOVE BIOGEL PI IND STRL 7.5 (GLOVE) ×2 IMPLANT
GOWN STRL REUS W/ TWL XL LVL3 (GOWN DISPOSABLE) ×1 IMPLANT
HEMOSTAT ARISTA ABSORB 3G PWDR (HEMOSTASIS) IMPLANT
HIBICLENS CHG 4% 4OZ BTL (MISCELLANEOUS) ×2 IMPLANT
KIT TURNOVER CYSTO (KITS) ×1 IMPLANT
MANIFOLD NEPTUNE II (INSTRUMENTS) ×1 IMPLANT
NS IRRIG 1000ML POUR BTL (IV SOLUTION) ×2 IMPLANT
PACK ABDOMINAL GYN (CUSTOM PROCEDURE TRAY) ×1 IMPLANT
PAD ARMBOARD 7.5X6 YLW CONV (MISCELLANEOUS) ×2 IMPLANT
PAD OB MATERNITY 4.3X12.25 (PERSONAL CARE ITEMS) ×1 IMPLANT
RTRCTR C-SECT PINK 25CM LRG (MISCELLANEOUS) IMPLANT
SPONGE T-LAP 18X18 ~~LOC~~+RFID (SPONGE) IMPLANT
SUT MNCRL AB 3-0 PS2 18 (SUTURE) IMPLANT
SUT VIC AB 0 CT1 18XCR BRD8 (SUTURE) IMPLANT
SUT VIC AB 0 CT1 27XBRD ANBCTR (SUTURE) IMPLANT
SUT VIC AB 3-0 CT1 TAPERPNT 27 (SUTURE) IMPLANT
SUT VICRYL 0 TIES 12 18 (SUTURE) IMPLANT
SYR CONTROL 10ML LL (SYRINGE) IMPLANT
TRAY FOLEY W/BAG SLVR 14FR (SET/KITS/TRAYS/PACK) ×1 IMPLANT

## 2023-05-24 NOTE — Brief Op Note (Signed)
05/24/2023  10:01 AM  PATIENT:  Christine Holland  37 y.o. female  PRE-OPERATIVE DIAGNOSIS:  Uterine fibroids  POST-OPERATIVE DIAGNOSIS:  Uterine fibroids  PROCEDURE:  Procedure(s): ABDOMINAL MYOMECTOMY (N/A)  SURGEON:  Surgeons and Role:    * Rosalyn Gess, MD - Primary    * Earley Favor, MD - assistant A physician assistant was needed due to surgical complexity.  ASSISTANTS: Circulator: Hardin Negus, RN Scrub Person: Falcon, Racheal T  ANESTHESIA:   general  EBL:  300 mL   BLOOD ADMINISTERED:none  DRAINS: none   LOCAL MEDICATIONS USED:  BUPIVICAINE   SPECIMEN:  Uterine fibroids  DISPOSITION OF SPECIMEN:  PATHOLOGY  COUNTS:  YES, correct  TOURNIQUET:  * No tourniquets in log *  DICTATION: .Note written in EPIC  PLAN OF CARE: Admit for overnight observation  PATIENT DISPOSITION:  PACU - hemodynamically stable.   Delay start of Pharmacological VTE agent (>24hrs) due to surgical blood loss or risk of bleeding: yes

## 2023-05-24 NOTE — Transfer of Care (Signed)
Immediate Anesthesia Transfer of Care Note  Patient: Christine Holland  Procedure(s) Performed: ABDOMINAL MYOMECTOMY (Abdomen)  Patient Location: PACU  Anesthesia Type:General  Level of Consciousness: awake, alert , oriented, and patient cooperative  Airway & Oxygen Therapy: Patient Spontanous Breathing and Patient connected to face mask oxygen  Post-op Assessment: Report given to RN and Post -op Vital signs reviewed and stable  Post vital signs: Reviewed and stable  Last Vitals:  Vitals Value Taken Time  BP 118/61 05/24/23 0951  Temp    Pulse 97 05/24/23 0956  Resp 19 05/24/23 0956  SpO2 100 % 05/24/23 0956  Vitals shown include unfiled device data.  Last Pain:  Vitals:   05/24/23 0619  TempSrc: Oral         Complications: No notable events documented.

## 2023-05-24 NOTE — Anesthesia Procedure Notes (Signed)
Procedure Name: Intubation Date/Time: 05/24/2023 7:34 AM  Performed by: Bishop Limbo, CRNAPre-anesthesia Checklist: Patient identified, Emergency Drugs available, Suction available and Patient being monitored Patient Re-evaluated:Patient Re-evaluated prior to induction Oxygen Delivery Method: Circle System Utilized Preoxygenation: Pre-oxygenation with 100% oxygen Induction Type: IV induction Ventilation: Mask ventilation without difficulty Laryngoscope Size: Mac and 3 Grade View: Grade I Tube type: Oral Tube size: 7.0 mm Number of attempts: 1 Airway Equipment and Method: Stylet and Bite block Placement Confirmation: ETT inserted through vocal cords under direct vision, positive ETCO2 and breath sounds checked- equal and bilateral Secured at: 22 cm Tube secured with: Tape Dental Injury: Teeth and Oropharynx as per pre-operative assessment

## 2023-05-24 NOTE — Op Note (Signed)
05/24/2023 Christine Holland [id number]   OPERATIVE REPORT  Preop Diagnosis: Uterine leiomyoma   Post operative diagnosis: same  Procedure: Abdominal myomectomy, greater than 250g.  Surgeon: Rosalyn Gess, MD A physician assistant was needed due to surgical complexity.  Assistant: Circulator: Hardin Negus, RN Scrub Person: Omer Jack T   Anesthesia: General Endotracheal anesthesia Fluids: 800cc Urine output: 50 cc  Complications: None  Findings: Normal pelvic survey.  Uterus with six fibroids.  Normal-appearing bilateral fallopian tubes and ovaries. The following fibroids were removed: Type 7 with wide base, fundal Type 6, right fundal Type 4, posterior Type 3, posterior, endometrium encountered Type 6, right lateral Type 4, anterior Given the fibroid number and type, it is recommended that the patient undergo delivery via C-section with any future pregnancies.  Estimated blood loss: 300 cc  Specimens:  ID Type Source Tests Collected by Time Destination  1 : uterine fibroids Tissue PATH Gyn benign resection SURGICAL PATHOLOGY Rosalyn Gess, MD 05/24/2023 [phone]   Total specimen weighed: 420g.  Disposition of specimen: Pathology    Procedure: Patient was taken to the OR where she was placed in supine position. SCDs were in place.  Vaginal cytotec was placed, and IV tranexamic acid was infusing.  The patient was prepped and draped in the usual sterile fashion.  A foley was inserted into the bladder.  An adequate timeout was obtained and everyone agreed.   A Pfannenstiel incision was created with the scalpel and taken down to the fascia judicious use of electrocautery.  The peritoneum was entered bluntly.  The uterus was exteriorized.  Pelvic survey was performed at this time with the above noted findings. Attention was turned to the type 7 fibroid, which was clamped at its base and excised with electrocautery. The base the fibroid was suture ligated with 0  vicryl. For the remaining fibroids, dilute vasopressin was injected in the serosa and myometrium of the fibroid.  An incision was made in the uterine serosa using electrocautery until the fibroid capsule was encountered.  Fibroids were removed with blunt dissection.  Uterine myometrium was reapproximated with 0 Vicryl in a running locked fashion.  Hemostasis was adequate.  The fascia was reapproximated with 0 Vicryl in a running fashion.  The subcutaneous layer was reapproximated with 2-0 Vicryl in a running fashion. 0.5% bupivacaine was infiltrated along the skin incision. The skin was closed with 3-0 Monocryl in a subcuticular fashion.  Steri-strips were applied over the incision.  A pressure dressing was applied.   All instrument, sponge and lap counts were correct x2. The patient was awakened taken to recovery room in stable condition.  Rosalyn Gess MD 05/24/2023 5:54 PM

## 2023-05-24 NOTE — Progress Notes (Signed)
Day of Surgery   Subjective/Chief Complaint: Patient recently transferred to [facility] main med surg floor due to capacity at [facility]. Reporting pain with transition to bed. Pain has been a 4/10, now a 6/10. No scheduled pain meds given while in PACU, just PRN. 650cc emptied from foley per RN prior to transfer. No HA, N/V, CP, SOB.  Objective: Vital signs in last 24 hours: Temp:  [97.2 F (36.2 C)-98.5 F (36.9 C)] 98.5 F (36.9 C) (12/10 1727) Pulse Rate:  [84-102] 88 (12/10 1727) Resp:  [0-25] 18 (12/10 1727) BP: (113-135)/(54-80) 128/71 (12/10 1727) SpO2:  [93 %-100 %] 100 % (12/10 1727) Weight:  [103.4 kg] 103.4 kg (12/10 0619)    Intake/Output from previous day: No intake/output data recorded. Intake/Output this shift: Total I/O In: 1150 [I.V.:950; IV Piggyback:200] Out: 1000 [Urine:700; Blood:300]  Physical Exam Vitals reviewed.  Constitutional:      General: She is not in acute distress.    Appearance: Normal appearance.  HENT:     Head: Normocephalic and atraumatic.     Nose: Nose normal.     Mouth/Throat:     Mouth: Mucous membranes are moist.  Eyes:     Extraocular Movements: Extraocular movements intact.     Conjunctiva/sclera: Conjunctivae normal.  Pulmonary:     Effort: Pulmonary effort is normal.  Abdominal:     General: There is no distension.     Palpations: Abdomen is soft.     Tenderness: There is no abdominal tenderness. There is no guarding or rebound.     Comments: Pressure dressing present  Genitourinary:    Comments: Foley draining clear urine Musculoskeletal:     Cervical back: Normal range of motion.     Comments: SCDs in place  Skin:    General: Skin is warm.  Neurological:     General: No focal deficit present.     Mental Status: She is alert and oriented to person, place, and time.  Psychiatric:        Mood and Affect: Mood normal.        Behavior: Behavior normal.      Lab Results:  Recent Labs    05/23/23 0914  WBC 4.3   HGB 11.0*  HCT 33.9*  PLT 157   BMET No results for input(s): "NA", "K", "CL", "CO2", "GLUCOSE", "BUN", "CREATININE", "CALCIUM" in the last 72 hours. PT/INR No results for input(s): "LABPROT", "INR" in the last 72 hours. ABG No results for input(s): "PHART", "HCO3" in the last 72 hours.  Invalid input(s): "PCO2", "PO2"  Studies/Results: No results found.  Anti-infectives: Anti-infectives (From admission, onward)    Start     Dose/Rate Route Frequency Ordered Stop   05/24/23 0600  ceFAZolin (ANCEF) IVPB 2g/100 mL premix        2 g 200 mL/hr over 30 Minutes Intravenous On call to O.R. 05/24/23 1610 05/24/23 0734      No intake/output data recorded.   Assessment/Plan: s/p Procedure(s): ABDOMINAL MYOMECTOMY (N/A) POD#0 labs to be drawn, behind due to transfer from surgical center Advance diet Continue foley due to POD#0, plan for removal on POD#1 AM, spontaneous VT Scheduled toradol and tylenol released, need to catch up on pain control. PRN meds ordered OOB and ambulate SCDs, lovenox ordered for AM Remove dressing in AM  LOS: 0 days    Niquan Charnley V [facility] 05/24/2023

## 2023-05-24 NOTE — Anesthesia Postprocedure Evaluation (Signed)
Anesthesia Post Note  Patient: Christine Holland  Procedure(s) Performed: ABDOMINAL MYOMECTOMY (Abdomen)     Patient location during evaluation: PACU Anesthesia Type: General Level of consciousness: awake and alert Pain management: pain level controlled Vital Signs Assessment: post-procedure vital signs reviewed and stable Respiratory status: spontaneous breathing, nonlabored ventilation, respiratory function stable and patient connected to nasal cannula oxygen Cardiovascular status: blood pressure returned to baseline and stable Postop Assessment: no apparent nausea or vomiting Anesthetic complications: no   No notable events documented.  Last Vitals:  Vitals:   05/24/23 1100 05/24/23 1115  BP: 113/62 126/70  Pulse: 89 92  Resp: 13 (!) 0  Temp:    SpO2: 96% 96%    Last Pain:  Vitals:   05/24/23 1100  TempSrc:   PainSc: 5                  Trevor Iha

## 2023-05-24 NOTE — Interval H&P Note (Signed)
History and Physical Interval Note:  05/24/2023 7:10 AM  Christine Holland  has presented today for surgery, with the diagnosis of fibroids.  The various methods of treatment have been discussed with the patient and family. After consideration of risks, benefits and other options for treatment, the patient has consented to  Procedure(s): ABDOMINAL MYOMECTOMY (N/A) as a surgical intervention.  The patient's history has been reviewed, patient examined, no change in status, stable for surgery.  I have reviewed the patient's chart and labs.  Questions were answered to the patient's satisfaction.     [name] [name]

## 2023-05-25 ENCOUNTER — Encounter (HOSPITAL_BASED_OUTPATIENT_CLINIC_OR_DEPARTMENT_OTHER): Payer: Self-pay | Admitting: Obstetrics and Gynecology

## 2023-05-25 DIAGNOSIS — D649 Anemia, unspecified: Secondary | ICD-10-CM | POA: Insufficient documentation

## 2023-05-25 DIAGNOSIS — D251 Intramural leiomyoma of uterus: Secondary | ICD-10-CM

## 2023-05-25 DIAGNOSIS — D259 Leiomyoma of uterus, unspecified: Secondary | ICD-10-CM | POA: Diagnosis not present

## 2023-05-25 DIAGNOSIS — D252 Subserosal leiomyoma of uterus: Secondary | ICD-10-CM | POA: Diagnosis not present

## 2023-05-25 LAB — CBC
HCT: 26.5 % — ABNORMAL LOW (ref 36.0–46.0)
HCT: 27.7 % — ABNORMAL LOW (ref 36.0–46.0)
Hemoglobin: 8.9 g/dL — ABNORMAL LOW (ref 12.0–15.0)
Hemoglobin: 9.2 g/dL — ABNORMAL LOW (ref 12.0–15.0)
MCH: 26.2 pg (ref 26.0–34.0)
MCH: 25.9 pg — ABNORMAL LOW (ref 26.0–34.0)
MCHC: 33.6 g/dL (ref 30.0–36.0)
MCHC: 33.2 g/dL (ref 30.0–36.0)
MCV: 77.9 fL — ABNORMAL LOW (ref 80.0–100.0)
MCV: 78 fL — ABNORMAL LOW (ref 80.0–100.0)
Platelets: 133 10*3/uL — ABNORMAL LOW (ref 150–400)
Platelets: 135 10*3/uL — ABNORMAL LOW (ref 150–400)
RBC: 3.4 MIL/uL — ABNORMAL LOW (ref 3.87–5.11)
RBC: 3.55 MIL/uL — ABNORMAL LOW (ref 3.87–5.11)
RDW: 13.3 % (ref 11.5–15.5)
RDW: 13.4 % (ref 11.5–15.5)
WBC: 8.6 10*3/uL (ref 4.0–10.5)
WBC: 8 10*3/uL (ref 4.0–10.5)
nRBC: 0 % (ref 0.0–0.2)
nRBC: 0 % (ref 0.0–0.2)

## 2023-05-25 LAB — BASIC METABOLIC PANEL
Anion gap: 6 (ref 5–15)
Anion gap: 4 — ABNORMAL LOW (ref 5–15)
BUN: 11 mg/dL (ref 6–20)
BUN: 13 mg/dL (ref 6–20)
CO2: 24 mmol/L (ref 22–32)
CO2: 23 mmol/L (ref 22–32)
Calcium: 7.9 mg/dL — ABNORMAL LOW (ref 8.9–10.3)
Calcium: 8 mg/dL — ABNORMAL LOW (ref 8.9–10.3)
Chloride: 102 mmol/L (ref 98–111)
Chloride: 107 mmol/L (ref 98–111)
Creatinine, Ser: 0.73 mg/dL (ref 0.44–1.00)
Creatinine, Ser: 0.87 mg/dL (ref 0.44–1.00)
GFR, Estimated: >60 mL/min (ref >60)
GFR, Estimated: >60 mL/min (ref >60)
Glucose, Bld: 111 mg/dL — ABNORMAL HIGH (ref 70–99)
Glucose, Bld: 96 mg/dL (ref 70–99)
Potassium: 3.4 mmol/L — ABNORMAL LOW (ref 3.5–5.1)
Potassium: 3.8 mmol/L (ref 3.5–5.1)
Sodium: 132 mmol/L — ABNORMAL LOW (ref 135–145)
Sodium: 134 mmol/L — ABNORMAL LOW (ref 135–145)

## 2023-05-25 LAB — SURGICAL PATHOLOGY

## 2023-05-25 MED ORDER — POTASSIUM CHLORIDE 20 MEQ PO PACK
40.0000 meq | PACK | Freq: Once | ORAL | Status: AC
  Administered 2023-05-25: 40 meq via ORAL
  Filled 2023-05-25: qty 2

## 2023-05-25 MED ORDER — SENNA 8.6 MG PO TABS: 1.0000 | ORAL_TABLET | Freq: Every day | ORAL | 0 refills | Status: AC

## 2023-05-25 NOTE — Discharge Summary (Signed)
Physician Discharge Summary  Patient ID: Christine Holland MRN: [id number] DOB/AGE: Dec 15, 1985 37 y.o.  Admit date: 05/24/2023 Discharge date: 05/25/2023  Admission Diagnoses: Principal Problem:   Uterine leiomyoma  Discharge Diagnoses:  Principal Problem:   Uterine leiomyoma Active Problems:   Postoperative anemia  Discharged Condition: good  Hospital Course: Christine Holland is a 37 y.o. female who presented for scheduled Procedure(s): ABDOMINAL MYOMECTOMY.  Her procedure was uncomplicated.  She was admitted for overnight observation and met criteria for discharge on POD#1.  Her pain was well-controlled, and she was ambulating without assistance.  She was voiding spontaneously.  She was tolerating a regular diet.  She was passing flatus.  Discharge instructions were reviewed with patient prior to discharge.  Consults: None  Significant Diagnostic Studies: labs: Hgb 9.2  Treatments: surgery: ABDOMINAL MYOMECTOMY: 58146 (CPT)   Discharge Exam: Blood pressure 129/70, pulse 94, temperature 98.8 F (37.1 C), temperature source Oral, resp. rate 18, height 5' 8.5" (1.74 m), weight 103.4 kg, last menstrual period 05/03/2023, SpO2 99%. Physical Exam Constitutional:      General: She is not in acute distress.    Appearance: Normal appearance.  HENT:     Head: Normocephalic and atraumatic.     Nose: Nose normal.     Mouth/Throat:     Mouth: Mucous membranes are moist.  Eyes:     Extraocular Movements: Extraocular movements intact.     Conjunctiva/sclera: Conjunctivae normal.  Cardiovascular:     Rate and Rhythm: Normal rate and regular rhythm.  Pulmonary:     Effort: Pulmonary effort is normal. No respiratory distress.     Breath sounds: No wheezing or rales.  Abdominal:     General: There is no distension.     Palpations: Abdomen is soft.     Tenderness: There is no abdominal tenderness. There is no guarding or rebound.     Comments: Incision intact, covered by steri  strips  Musculoskeletal:     Cervical back: Normal range of motion.  Skin:    General: Skin is warm.  Neurological:     General: No focal deficit present.     Mental Status: She is alert and oriented to person, place, and time.  Psychiatric:        Mood and Affect: Mood normal.        Behavior: Behavior normal.   Disposition: Discharge disposition: 01-Home or Self Care     Home  Discharge Instructions     Call MD for:  difficulty breathing, headache or visual disturbances   Complete by: As directed    Call MD for:  persistant dizziness or light-headedness   Complete by: As directed    Call MD for:  persistant nausea and vomiting   Complete by: As directed    Call MD for:  redness, tenderness, or signs of infection (pain, swelling, redness, odor or green/yellow discharge around incision site)   Complete by: As directed    Call MD for:  severe uncontrolled pain   Complete by: As directed    Call MD for:  temperature >100.4   Complete by: As directed    Diet general   Complete by: As directed    Increase activity slowly   Complete by: As directed       Allergies as of 05/25/2023       Reactions   Other Anaphylaxis, Shortness Of Breath, Itching, Swelling, Other (See Comments)   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   Walnut Anaphylaxis, Shortness Of  Breath, Itching, Swelling, Other (See Comments)   PER PATIENT: "ALL TREE NUTS" cause trouble breathing, throat swelling, itching   Apple Itching, Other (See Comments)   Mouth, throat, lips- ITCH   Pineapple Itching, Other (See Comments)   Mouth, throat, lips- ITCH        Medication List     TAKE these medications    acetaminophen 500 MG tablet Commonly known as: TYLENOL Take 2 tablets (1,000 mg total) by mouth every 8 (eight) hours as needed.   ibuprofen 800 MG tablet Commonly known as: ADVIL Take 1 tablet (800 mg total) by mouth every 8 (eight) hours as needed for up to 42 doses.   oxyCODONE 5 MG immediate release  tablet Commonly known as: Roxicodone On day 1, take 1 tablet by mouth every 4 hours as needed. On day 2, take 1 tablet by mouth every 6 hours as needed. On day 3, take 1 tablet by mouth every 8 hours as needed. On day 4, take 1 tablet by mouth every 12 hours as needed.   senna 8.6 MG Tabs tablet Commonly known as: SENOKOT Take 1 tablet (8.6 mg total) by mouth daily for 14 days.       SignedRosalyn Gess 05/25/2023, 5:48 PM

## 2023-05-25 NOTE — Discharge Instructions (Addendum)
Start a daily iron supplement.  Remove steri strips in 7 days.  Take ibuprofen and tylenol as prescribed. Use oxycodone as needed.

## 2023-05-25 NOTE — Progress Notes (Signed)
Mobility Specialist - Progress Note   05/25/23 1021  Mobility  Activity Ambulated independently in hallway  Level of Assistance Independent  Assistive Device None  Distance Ambulated (ft) 500 ft  Activity Response Tolerated well  Mobility Referral Yes  Mobility visit 1 Mobility  Mobility Specialist Start Time (ACUTE ONLY) 0959  Mobility Specialist Stop Time (ACUTE ONLY) 1020  Mobility Specialist Time Calculation (min) (ACUTE ONLY) 21 min   Pt received in recliner and agreeable to mobility. No complaints during session. Pt to bed after session with all needs met.    [facility]

## 2023-05-25 NOTE — Progress Notes (Signed)
1 Day Post-Op   Subjective/Chief Complaint: No acute events overnight. Tolerating clears. Pain has been a 3/10. Ambulated twice since last night. +flatus. No HA, N/V, CP, SOB.  Objective: Vital signs in last 24 hours: Temp:  [97.2 F (36.2 C)-100.1 F (37.8 C)] 98.8 F (37.1 C) (12/11 0842) Pulse Rate:  [75-97] 83 (12/11 0842) Resp:  [0-23] 18 (12/11 0842) BP: (110-128)/(58-76) 110/62 (12/11 0842) SpO2:  [93 %-100 %] 100 % (12/11 0842)    Intake/Output from previous day: 12/10 0701 - 12/11 0700 In: 2310.1 [I.V.:2110.1; IV Piggyback:200] Out: 2600 [Urine:2300; Blood:300] Intake/Output this shift: Total I/O In: 120 [P.O.:120] Out: 150 [Urine:150]  Physical Exam Vitals reviewed.  Constitutional:      General: She is not in acute distress.    Appearance: Normal appearance.  HENT:     Head: Normocephalic and atraumatic.     Nose: Nose normal.     Mouth/Throat:     Mouth: Mucous membranes are moist.  Eyes:     Extraocular Movements: Extraocular movements intact.     Conjunctiva/sclera: Conjunctivae normal.  Cardiovascular:     Rate and Rhythm: Normal rate and regular rhythm.  Pulmonary:     Effort: Pulmonary effort is normal. No respiratory distress.     Breath sounds: No wheezing or rales.  Abdominal:     General: Bowel sounds are normal. There is no distension.     Palpations: Abdomen is soft.     Tenderness: There is no abdominal tenderness. There is no guarding or rebound.     Comments: Incision intact, covered by steri strips  Genitourinary:    Comments: Foley draining clear urine Musculoskeletal:     Cervical back: Normal range of motion.     Comments: SCDs in place  Skin:    General: Skin is warm.  Neurological:     General: No focal deficit present.     Mental Status: She is alert and oriented to person, place, and time.  Psychiatric:        Mood and Affect: Mood normal.        Behavior: Behavior normal.      Lab Results:  Recent Labs     05/24/23 1810 05/25/23 0437  WBC 8.0 8.6  HGB 9.8* 8.9*  HCT 29.9* 26.5*  PLT 159 133*   BMET Recent Labs    05/24/23 1810 05/25/23 0437  NA 132* 132*  K 3.9 3.4*  CL 102 102  CO2 21* 24  GLUCOSE 129* 111*  BUN 12 11  CREATININE 0.85 0.73  CALCIUM 8.2* 7.9*   PT/INR No results for input(s): "LABPROT", "INR" in the last 72 hours. ABG No results for input(s): "PHART", "HCO3" in the last 72 hours.  Invalid input(s): "PCO2", "PO2"  Studies/Results: No results found.  Anti-infectives: Anti-infectives (From admission, onward)    Start     Dose/Rate Route Frequency Ordered Stop   05/24/23 0600  ceFAZolin (ANCEF) IVPB 2g/100 mL premix        2 g 200 mL/hr over 30 Minutes Intravenous On call to O.R. 05/24/23 1610 05/24/23 0734      I/O last 3 completed shifts: In: 2310.1 [I.V.:2110.1; IV Piggyback:200] Out: 2600 [Urine:2300; Blood:300]   Assessment/Plan: s/p Procedure(s): ABDOMINAL MYOMECTOMY (N/A) POD#1  Hgb 11.0 >> 8.9. Repeat at 1400 today.  Na 132, K 3.4.  Discontinued IVF. PO K repletion ordered. d/c foley Advance diet Scheduled toradol and tylenol, continue. Pain well controlled OOB and ambulate SCDs, lovenox fopr ppx  Anticipate discharge  this evening pending stable Hgb.  LOS: 0 days    Thuan Tippett V [facility] 05/25/2023

## 2023-05-25 NOTE — TOC CM/SW Note (Signed)
Transition of Care [facility]) - Inpatient Brief Assessment   Patient Details  Name: Christine Holland MRN: [id number] Date of Birth: 08/08/1985  [facility]) CM/SW Contact:    Howell Rucks, RN Phone Number: 05/25/2023, 10:22 AM   Clinical Narrative: Met with pt at bedside to introduce role of TOC/NCM and review for dc planning. Pt  confirmed she has an established PCP and pharmacy, no current home care services or home DME, pt reports she feels safe returning home with support from her family, reports transportation is available at discharge. TOC Brief Assessment completed. No TOC needs identified at this time.     Transition of Care Asessment: Insurance and Status: Insurance coverage has been reviewed Patient has primary care physician: Yes Home environment has been reviewed: resides in private residence with support from family Prior level of function:: Independent Prior/Current Home Services: No current home services Social Determinants of Health Reivew: SDOH reviewed no interventions necessary Readmission risk has been reviewed: Yes Transition of care needs: no transition of care needs at this time

## 2023-05-25 NOTE — Progress Notes (Signed)
Discharge instructions discussed with patient and family, verbalized agreement and understanding 

## 2023-05-28 LAB — BPAM RBC
Blood Product Expiration Date: 202501032359
Blood Product Expiration Date: 202501032359
Unit Type and Rh: 5100
Unit Type and Rh: 5100

## 2023-05-28 LAB — TYPE AND SCREEN
ABO/RH(D): O POS
Antibody Screen: NEGATIVE
Unit division: 0
Unit division: 0

## 2023-06-03 ENCOUNTER — Encounter: Payer: Self-pay | Admitting: Obstetrics and Gynecology

## 2023-06-09 ENCOUNTER — Ambulatory Visit (INDEPENDENT_AMBULATORY_CARE_PROVIDER_SITE_OTHER): Payer: 59 | Admitting: Obstetrics and Gynecology

## 2023-06-09 ENCOUNTER — Encounter: Payer: Self-pay | Admitting: Obstetrics and Gynecology

## 2023-06-09 VITALS — BP 120/72 | HR 95 | Temp 97.7°F | Wt 223.0 lb

## 2023-06-09 DIAGNOSIS — Z4889 Encounter for other specified surgical aftercare: Secondary | ICD-10-CM

## 2023-06-09 NOTE — Progress Notes (Signed)
37 y.o. G0P0000 female 2 wk s/p abdominal myomectomy (greater than 250g) for uterine leiomyoma here for postoperative visit.  Engaged, marriage planned for February 2025.  Patient's last menstrual period was 05/03/2023 (approximate).   Pt states she is doing well. No pain today however she occasionally notes sharp intermittent pain throughout her abdomen.  She also notes some discomfort under her umbilicus that improves with a heating pad. Denies N/V, VB, dysuria. Normal BM and voids.  05/24/2023: A. UTERINE, FIBROIDS, RESECTION:       Leiomyomata.       Negative for malignancy.   Birth control: none Sexually active: no, however in a relationship.  Undecided on contraception after marriage. No CP or SOB.  GYN HISTORY: No significant history  OB History  Gravida Para Term Preterm AB Living  0 0 0 0 0 0  SAB IAB Ectopic Multiple Live Births  0 0 0 0 0    Past Medical History:  Diagnosis Date   Alpha-thalassemia (HCC) 2016   (pt is a carrier)  previouly followed by  dr Cyndie Chime then followed by dr Estill Cotta note w/ sarah cinciniti NP in epic 07-03-2014   Anemia    Bilateral fibroadenomas of breasts 2008   GERD (gastroesophageal reflux disease)    Pancytopenia (HCC) 2013   mild,  dx 2013   Pre-diabetes    Sickle cell trait (HCC)    Uterine leiomyoma    Wears glasses     Past Surgical History:  Procedure Laterality Date   BREAST LUMPECTOMY Bilateral 2008   in [place]   MYOMECTOMY N/A 05/24/2023   Procedure: ABDOMINAL MYOMECTOMY;  Surgeon: Rosalyn Gess, MD;  Location: [facility];  Service: Gynecology;  Laterality: N/A;    Current Outpatient Medications on File Prior to Visit  Medication Sig Dispense Refill   acetaminophen (TYLENOL) 500 MG tablet Take 2 tablets (1,000 mg total) by mouth every 8 (eight) hours as needed. (Patient not taking: Reported on 05/24/2023) 90 tablet 0   ibuprofen (ADVIL) 800 MG tablet Take 1 tablet (800 mg total) by mouth  every 8 (eight) hours as needed for up to 42 doses. (Patient not taking: Reported on 05/24/2023) 42 tablet 0   oxyCODONE (ROXICODONE) 5 MG immediate release tablet On day 1, take 1 tablet by mouth every 4 hours as needed. On day 2, take 1 tablet by mouth every 6 hours as needed. On day 3, take 1 tablet by mouth every 8 hours as needed. On day 4, take 1 tablet by mouth every 12 hours as needed. (Patient not taking: Reported on 05/24/2023) 15 tablet 0   No current facility-administered medications on file prior to visit.    Allergies  Allergen Reactions   Other Anaphylaxis, Shortness Of Breath, Itching, Swelling and Other (See Comments)    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   Walnut Anaphylaxis, Shortness Of Breath, Itching, Swelling and Other (See Comments)    PER PATIENT: "ALL TREE NUTS" cause trouble breathing, throat swelling, itching   Apple Itching and Other (See Comments)    Mouth, throat, lips- ITCH   Pineapple Itching and Other (See Comments)    Mouth, throat, lips- ITCH      PE There were no vitals filed for this visit.   There is no height or weight on file to calculate BMI.  Physical Exam Vitals reviewed.  Constitutional:      General: She is not in acute distress.    Appearance: Normal appearance.  HENT:  Head: Normocephalic and atraumatic.     Nose: Nose normal.  Eyes:     Extraocular Movements: Extraocular movements intact.     Conjunctiva/sclera: Conjunctivae normal.  Pulmonary:     Effort: Pulmonary effort is normal.  Abdominal:     General: There is no distension.     Palpations: Abdomen is soft.     Tenderness: There is abdominal tenderness (mild under umbilicus). There is no guarding or rebound.  Musculoskeletal:        General: Normal range of motion.     Cervical back: Normal range of motion.  Neurological:     General: No focal deficit present.     Mental Status: She is alert.  Psychiatric:        Mood and Affect: Mood normal.        Behavior: Behavior  normal.     Assessment and Plan:        Postoperative visit  Normal post exam. Steri strips removed from incision. Reinforced no heavy lifting and need for contraception for 6 months following surgery. RTO in 2 wk   Rosalyn Gess, MD

## 2023-06-19 ENCOUNTER — Encounter: Payer: Self-pay | Admitting: Obstetrics and Gynecology

## 2023-06-20 NOTE — Telephone Encounter (Signed)
 Patient left message requesting return call.  Spoke with patient, patient is inquiring about short term disability forms, may need date updated. Advised patient I have not received disability forms, will check with front office as well. Patient states she understood she may be able to RTW to work after 30 days/4weeks, but feels like she needs 6 weeks. Patient received prior message, will wait for pre-op for further evaluation and RTW clearance.   Reviewed disability form process, will have Arland from front office contact patient to assist with ROI forms and fee. Patient will contact employer for forms. Questions answered. Patient appreciative of call.   Spoke with Arland at front office, she will contact patient on 06/21/23.

## 2023-06-20 NOTE — Telephone Encounter (Signed)
 Call placed to patient, left detailed message, ok per dpr. Advised per Dr. Kennith Center, return call to office if any additional questions.   Encounter closed.

## 2023-06-20 NOTE — Telephone Encounter (Signed)
 S/p abdominal myomectomy 05/24/23 Seen for 2 week post-op 06/09/23.   Routing to Dr. Kennith Center -please review and advise on RTW request.

## 2023-06-24 DIAGNOSIS — Z0289 Encounter for other administrative examinations: Secondary | ICD-10-CM

## 2023-07-04 ENCOUNTER — Ambulatory Visit (INDEPENDENT_AMBULATORY_CARE_PROVIDER_SITE_OTHER): Payer: 59 | Admitting: Obstetrics and Gynecology

## 2023-07-04 ENCOUNTER — Encounter: Payer: Self-pay | Admitting: Obstetrics and Gynecology

## 2023-07-04 VITALS — BP 108/70 | HR 85 | Wt 226.0 lb

## 2023-07-04 DIAGNOSIS — D219 Benign neoplasm of connective and other soft tissue, unspecified: Secondary | ICD-10-CM

## 2023-07-04 DIAGNOSIS — Z4889 Encounter for other specified surgical aftercare: Secondary | ICD-10-CM

## 2023-07-04 NOTE — Progress Notes (Signed)
38 y.o. G0P0000 female 6 wk s/p abdominal myomectomy (greater than 250g) for uterine leiomyoma here for postoperative visit.  Engaged, wedding moved to May/June 2025.  Patient's last menstrual period was 06/30/2023 (exact date).  Pt reports back pain at night and left leg numbness when she awakes in the morning.  No pain today.  Denies N/V, SOB, CP, VB, dysuria. Normal BM and voids.  Reports last meses was heavy and painful.  Use ibuprofen as needed.  05/24/2023: A. UTERINE, FIBROIDS, RESECTION:       Leiomyomata.       Negative for malignancy.   Birth control: none Sexually active: no, however in a relationship.  Undecided on contraception after marriage. No CP or SOB.  GYN HISTORY: No significant history  OB History  Gravida Para Term Preterm AB Living  0 0 0 0 0 0  SAB IAB Ectopic Multiple Live Births  0 0 0 0 0    Past Medical History:  Diagnosis Date   Alpha-thalassemia (HCC) 2016   (pt is a carrier)  previouly followed by  dr Cyndie Chime then followed by dr Estill Cotta note w/ sarah cinciniti NP in epic 07-03-2014   Anemia    Bilateral fibroadenomas of breasts 2008   GERD (gastroesophageal reflux disease)    Pancytopenia (HCC) 2013   mild,  dx 2013   Pre-diabetes    Sickle cell trait (HCC)    Uterine leiomyoma    Wears glasses     Past Surgical History:  Procedure Laterality Date   BREAST LUMPECTOMY Bilateral 2008   in [place]   MYOMECTOMY N/A 05/24/2023   Procedure: ABDOMINAL MYOMECTOMY;  Surgeon: Rosalyn Gess, MD;  Location: [facility];  Service: Gynecology;  Laterality: N/A;    Current Outpatient Medications on File Prior to Visit  Medication Sig Dispense Refill   ibuprofen (ADVIL) 800 MG tablet Take 1 tablet (800 mg total) by mouth every 8 (eight) hours as needed for up to 42 doses. 42 tablet 0   No current facility-administered medications on file prior to visit.    Allergies  Allergen Reactions   Other Anaphylaxis, Shortness  Of Breath, Itching, Swelling and Other (See Comments)    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   Walnut Anaphylaxis, Shortness Of Breath, Itching, Swelling and Other (See Comments)    PER PATIENT: "ALL TREE NUTS" cause trouble breathing, throat swelling, itching   Apple Itching and Other (See Comments)    Mouth, throat, lips- ITCH   Pineapple Itching and Other (See Comments)    Mouth, throat, lips- ITCH      PE Today's Vitals   07/04/23 0904  BP: 108/70  Pulse: 85  SpO2: 99%  Weight: 226 lb (102.5 kg)     Body mass index is 33.86 kg/m.  Physical Exam Vitals reviewed.  Constitutional:      General: She is not in acute distress.    Appearance: Normal appearance.  HENT:     Head: Normocephalic and atraumatic.     Nose: Nose normal.  Eyes:     Extraocular Movements: Extraocular movements intact.     Conjunctiva/sclera: Conjunctivae normal.  Pulmonary:     Effort: Pulmonary effort is normal.  Abdominal:     General: There is no distension.     Palpations: Abdomen is soft.     Tenderness: There is no abdominal tenderness. There is no guarding or rebound.     Comments: Pfannenstiel incision well-healed  Musculoskeletal:  General: Normal range of motion.     Cervical back: Normal range of motion.  Neurological:     General: No focal deficit present.     Mental Status: She is alert.  Psychiatric:        Mood and Affect: Mood normal.        Behavior: Behavior normal.     Assessment and Plan:        Postoperative visit  Normal post exam.  Cleared to return to work and exercise. Recommend core exercises to improve core strength and minimize lower back pain. Reinforced need for contraception for 6 months following surgery. Encourage use of prenatals. RTO for annual exam or as needed  Rosalyn Gess, MD

## 2023-08-22 ENCOUNTER — Ambulatory Visit: Admitting: Obstetrics and Gynecology

## 2023-08-22 ENCOUNTER — Telehealth: Payer: Self-pay | Admitting: *Deleted

## 2023-08-22 VITALS — BP 108/60 | HR 107 | Temp 98.7°F | Wt 235.0 lb

## 2023-08-22 DIAGNOSIS — M5442 Lumbago with sciatica, left side: Secondary | ICD-10-CM | POA: Diagnosis not present

## 2023-08-22 MED ORDER — CYCLOBENZAPRINE HCL 10 MG PO TABS: 10.0000 mg | ORAL_TABLET | Freq: Three times a day (TID) | ORAL | 0 refills | Status: DC | PRN

## 2023-08-22 MED ORDER — IBUPROFEN 800 MG PO TABS: 800.0000 mg | ORAL_TABLET | Freq: Three times a day (TID) | ORAL | 0 refills | Status: DC | PRN

## 2023-08-22 NOTE — Telephone Encounter (Signed)
 FW: Appointment Request  Christine Holland  Sent: Mon August 22, 2023 10:39 AM  To: [facility] Triage    Christine Holland  MRN: [id number] DOB: Sep 07, 1985  Pt Home: [phone]    Entered: [phone]      Message  Please advise.  ----- Message -----  From: Leota Sauers  Sent: 08/22/2023   1:20 AM EDT  To: [facility] Admin  Subject: Appointment Request                              Appointment Request From: Leota Sauers    With Provider: Rosalyn Gess [facility]]    Preferred Date Range: 08/22/2023 - 08/22/2023    Preferred Times: Any Time    Reason for visit: Office Visit    Health Maintenance Topic:    Comments:  Hi, I have had a myomectomy on Dec 10th with Dr Kennith Center. My last post- Op visit was on January 21. I did mention some back pain and some tingling/weakness on my left leg. As of last week my pain has worsened. It's radiating from my lower left belly to my thigh all the way to my knee. Whether I put pressure or it or not. It's becoming harder to walk right. Can I see Dr Kennith Center asap ?

## 2023-08-22 NOTE — Progress Notes (Signed)
 38 y.o. G0P0000 female 3 month s/p abdominal myomectomy (greater than 250g) for uterine leiomyoma here for back pain.  Engaged, wedding moved to May/June 2025. Works from home.  Pt reports left lower back and pelvic pain radiating down her leg to her knee for about 20 mins off and on. Has tingling and numbness of lower back and left thigh. Some cramping in lower pelvic area and back pain. Worse at night but still feel it throughout the day. Symptoms started after surgery in December but recently have gotten worse. Now affecting her ability to walk due to pain. Has avoided pain medication unless completely necessary.  Denies pressure or heaviness with walking. No fevers, N/V. Normal BM.  Patient's last menstrual period was 07/30/2023 (approximate). Period Duration (Days): 5 Period Pattern: Regular Menstrual Flow: Heavy Menstrual Control: Maxi pad Dysmenorrhea: (!) Moderate Dysmenorrhea Symptoms: Diarrhea  GYN HISTORY: Ab myomectomy  OB History  Gravida Para Term Preterm AB Living  0 0 0 0 0 0  SAB IAB Ectopic Multiple Live Births  0 0 0 0 0    Past Medical History:  Diagnosis Date   Alpha-thalassemia (HCC) 2016   (pt is a carrier)  previouly followed by  dr Cyndie Chime then followed by dr Estill Cotta note w/ sarah cinciniti NP in epic 07-03-2014   Anemia    Bilateral fibroadenomas of breasts 2008   GERD (gastroesophageal reflux disease)    Pancytopenia (HCC) 2013   mild,  dx 2013   Pre-diabetes    Sickle cell trait (HCC)    Uterine leiomyoma    Wears glasses     Past Surgical History:  Procedure Laterality Date   BREAST LUMPECTOMY Bilateral 2008   in [place]   MYOMECTOMY N/A 05/24/2023   Procedure: ABDOMINAL MYOMECTOMY;  Surgeon: Rosalyn Gess, MD;  Location: [facility];  Service: Gynecology;  Laterality: N/A;    No current outpatient medications on file prior to visit.   No current facility-administered medications on file prior to visit.     Allergies  Allergen Reactions   Other Anaphylaxis, Shortness Of Breath, Itching, Swelling and Other (See Comments)    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   Walnut Anaphylaxis, Shortness Of Breath, Itching, Swelling and Other (See Comments)    PER PATIENT: "ALL TREE NUTS" cause trouble breathing, throat swelling, itching   Apple Itching and Other (See Comments)    Mouth, throat, lips- ITCH   Pineapple Itching and Other (See Comments)    Mouth, throat, lips- ITCH      PE Today's Vitals   08/22/23 1552  BP: 108/60  Pulse: (!) 107  Temp: 98.7 F (37.1 C)  TempSrc: Oral  SpO2: 99%  Weight: 235 lb (106.6 kg)   Body mass index is 35.21 kg/m.  Physical Exam Vitals reviewed.  Constitutional:      General: She is not in acute distress.    Appearance: Normal appearance.  HENT:     Head: Normocephalic and atraumatic.     Nose: Nose normal.  Eyes:     Extraocular Movements: Extraocular movements intact.     Conjunctiva/sclera: Conjunctivae normal.  Pulmonary:     Effort: Pulmonary effort is normal.  Abdominal:     General: There is no distension.     Palpations: Abdomen is soft.     Tenderness: There is no abdominal tenderness. There is no guarding or rebound.     Comments: Pfannenstiel well healed  Musculoskeletal:  General: Normal range of motion.     Cervical back: Normal range of motion.     Comments: Abnormal gait No tenderness of back Pain with figure of 4 stretch of left leg  Neurological:     General: No focal deficit present.     Mental Status: She is alert.  Psychiatric:        Mood and Affect: Mood normal.        Behavior: Behavior normal.       Assessment and Plan:        Left-sided low back pain with left-sided sciatica, unspecified chronicity -     Ibuprofen; Take 1 tablet (800 mg total) by mouth every 8 (eight) hours as needed for up to 42 doses.  Dispense: 42 tablet; Refill: 0 -     Cyclobenzaprine HCl; Take 1 tablet (10 mg total) by mouth 3  (three) times daily as needed for muscle spasms.  Dispense: 30 tablet; Refill: 0 -     Ambulatory referral to Physical Therapy  Patient now 3 months out from surgery. Suspect symptoms are lower back pain now with unilateral sciatica from myofasical tension/unresolved pain Restart NSAIDS, flexeril Start PT  Rosalyn Gess, MD

## 2023-08-22 NOTE — Telephone Encounter (Signed)
 Spoke with patient. OV scheduled for today at 4pm with Dr. Kennith Center.   Patient agreeable to date and time.  Will also keep PCP appt as scheduled.   Encounter closed.

## 2023-08-22 NOTE — Telephone Encounter (Signed)
 Spoke with patient. Patient reports continued pain with tingling and weakness in in lower back, extending into left thigh and knee. Denies swelling, redness, warmth. Describes as "spasms" coming and going. Symptoms do not change sitting or standing.   Denies any GYN symptoms.   Asked patient if she has seen her PCP for evaluation yet? Patient states she is scheduled for OV tomorrow. Advised patient to keep OV as scheduled with PCP for further evaluation. I will provide update to Dr. Kennith Center and return call if any additional recommendations. Patient agreeable and appreciative of call.   Dr. Kennith Center - please review.

## 2023-08-23 ENCOUNTER — Ambulatory Visit: Admitting: Family Medicine

## 2023-08-23 VITALS — BP 112/76 | HR 85 | Temp 98.1°F | Resp 16 | Ht 68.5 in | Wt 232.4 lb

## 2023-08-23 DIAGNOSIS — M5432 Sciatica, left side: Secondary | ICD-10-CM

## 2023-08-23 MED ORDER — PREDNISONE 20 MG PO TABS: 40.0000 mg | ORAL_TABLET | Freq: Every day | ORAL | 0 refills | Status: AC

## 2023-08-23 NOTE — Therapy (Deleted)
 OUTPATIENT PHYSICAL THERAPY FEMALE PELVIC EVALUATION   Patient Name: Christine Holland MRN: [id number] DOB:25-Oct-1985, 38 y.o., female Today's Date: 08/23/2023  END OF SESSION:   Past Medical History:  Diagnosis Date   Alpha-thalassemia (HCC) 2016   (pt is a carrier)  previouly followed by  dr Cyndie Chime then followed by dr Estill Cotta note w/ sarah cinciniti NP in epic 07-03-2014   Anemia    Bilateral fibroadenomas of breasts 2008   GERD (gastroesophageal reflux disease)    Pancytopenia (HCC) 2013   mild,  dx 2013   Pre-diabetes    Sickle cell trait (HCC)    Uterine leiomyoma    Wears glasses    Past Surgical History:  Procedure Laterality Date   BREAST LUMPECTOMY Bilateral 2008   in [place]   MYOMECTOMY N/A 05/24/2023   Procedure: ABDOMINAL MYOMECTOMY;  Surgeon: Rosalyn Gess, MD;  Location: [facility] [place];  Service: Gynecology;  Laterality: N/A;   Patient Active Problem List   Diagnosis Date Noted   Postoperative anemia 05/25/2023   Uterine leiomyoma 05/24/2023   Fibroids 04/13/2023   FIBROADENOMA, BREAST 11/16/2009   ANA POSITIVE 10/05/2009   URINALYSIS, ABNORMAL 07/29/2009   SICKLE-CELL TRAIT 07/06/2009   OTHER NEUTROPENIA 07/06/2009   BREAST TENDERNESS 06/10/2009   BREAST MASS, RIGHT 06/10/2009    PCP: Jeani Sow, MD  REFERRING PROVIDER: Rosalyn Gess, MD   REFERRING DIAG: [phone] (ICD-10-CM) - Left-sided low back pain with left-sided sciatica, unspecified chronicity   THERAPY DIAG:  No diagnosis found.  Rationale for Evaluation and Treatment: Rehabilitation  ONSET DATE: ***  SUBJECTIVE:                                                                                                                                                                                           SUBJECTIVE STATEMENT:  Uterus with six fibroids Left back and hip pain, radiating to knee, likely sciatica, started following abdominal myomectomy; s/p  abdominal myomectomy (greater than 250g) for uterine leiomyoma . Engaged, wedding moved to May/June 2025.  Worsening pain, now causing limping Fluid intake:   PAIN:  Are you having pain? {yes/no:20286} NPRS scale: ***/10 Pain location: {pelvic pain location:27098}  Pain type: {type:313116} Pain description: {PAIN DESCRIPTION:21022940}   Aggravating factors: *** Relieving factors: ***  PRECAUTIONS: {Therapy precautions:24002}  RED FLAGS: {PT Red Flags:29287}   WEIGHT BEARING RESTRICTIONS: No  FALLS:  Has patient fallen in last 6 months? {fallsyesno:27318}  OCCUPATION: ***  ACTIVITY LEVEL : ***  PLOF: {PLOF:24004}  PATIENT GOALS: ***  PERTINENT HISTORY:  Myomectomy 05/24/23 Sexual abuse: {Yes/No:304960894}  BOWEL MOVEMENT: Pain with bowel movement: {yes/no:20286}  Type of bowel movement:{PT BM type:27100} Fully empty rectum: {No/Yes:304960894} Leakage: {Yes/No:304960894} Pads: {Yes/No:304960894} Fiber supplement/laxative {YES/NO AS:20300}  URINATION: Pain with urination: {yes/no:20286} Fully empty bladder: {Yes/No:304960894}*** Stream: {PT urination:27102} Urgency: {YES/NO AS:20300} Frequency: *** Leakage: {PT leakage:27103} Pads: {Yes/No:304960894}  INTERCOURSE:  Ability to have vaginal penetration {YES/NO:21197} Pain with intercourse: {pain with intercourse PA:27099} Dryness{YES/NO AS:20300} Climax: *** Marinoff Scale: ***/3 Laxative:  PREGNANCY: Vaginal deliveries *** Tearing {Yes***/No:304960894} Episiotomy {YES/NO AS:20300} C-section deliveries *** Currently pregnant {Yes***/No:304960894}  PROLAPSE: {PT prolapse:27101}   OBJECTIVE:  Note: Objective measures were completed at Evaluation unless otherwise noted.  DIAGNOSTIC FINDINGS:  ***  PATIENT SURVEYS:  {rehab surveys:24030}  PFIQ-7: ***  COGNITION: Overall cognitive status: {cognition:24006}     SENSATION: Light touch: {intact/deficits:24005}  LUMBAR SPECIAL TESTS:   {lumbar special test:25242}  FUNCTIONAL TESTS:  {Functional tests:24029}  GAIT: Assistive device utilized: {Assistive devices:23999} Comments: ***  POSTURE: {posture:25561}   LUMBARAROM/PROM:  A/PROM A/PROM  eval  Flexion   Extension   Right lateral flexion   Left lateral flexion   Right rotation   Left rotation    (Blank rows = not tested)  LOWER EXTREMITY ROM:  {AROM/PROM:27142} ROM Right eval Left eval  Hip flexion    Hip extension    Hip abduction    Hip adduction    Hip internal rotation    Hip external rotation    Knee flexion    Knee extension    Ankle dorsiflexion    Ankle plantarflexion    Ankle inversion    Ankle eversion     (Blank rows = not tested)  LOWER EXTREMITY MMT:  MMT Right eval Left eval  Hip flexion    Hip extension    Hip abduction    Hip adduction    Hip internal rotation    Hip external rotation    Knee flexion    Knee extension    Ankle dorsiflexion    Ankle plantarflexion    Ankle inversion    Ankle eversion     (Blank rows = not tested) PALPATION:   General: ***  Pelvic Alignment: ***  Abdominal: ***                External Perineal Exam: ***                             Internal Pelvic Floor: ***  Patient confirms identification and approves PT to assess internal pelvic floor and treatment {yes/no:20286}  PELVIC MMT:   MMT eval  Vaginal   Internal Anal Sphincter   External Anal Sphincter   Puborectalis   Diastasis Recti   (Blank rows = not tested)        TONE: ***  PROLAPSE: ***  TODAY'S TREATMENT:  DATE: ***  EVAL ***   PATIENT EDUCATION:  Education details: *** Person educated: {Person educated:25204} Education method: {Education Method:25205} Education comprehension: {Education Comprehension:25206}  HOME EXERCISE PROGRAM: ***  ASSESSMENT:  CLINICAL  IMPRESSION: Patient is a *** y.o. *** who was seen today for physical therapy evaluation and treatment for ***.   OBJECTIVE IMPAIRMENTS: {opptimpairments:25111}.   ACTIVITY LIMITATIONS: {activitylimitations:27494}  PARTICIPATION LIMITATIONS: {participationrestrictions:25113}  PERSONAL FACTORS: {Personal factors:25162} are also affecting patient's functional outcome.   REHAB POTENTIAL: {rehabpotential:25112}  CLINICAL DECISION MAKING: {clinical decision making:25114}  EVALUATION COMPLEXITY: {Evaluation complexity:25115}   GOALS: Goals reviewed with patient? {yes/no:20286}  SHORT TERM GOALS: Target date: ***  *** Baseline: Goal status: INITIAL  2.  *** Baseline:  Goal status: INITIAL  3.  *** Baseline:  Goal status: INITIAL  4.  *** Baseline:  Goal status: INITIAL  5.  *** Baseline:  Goal status: INITIAL  6.  *** Baseline:  Goal status: INITIAL  LONG TERM GOALS: Target date: ***  *** Baseline:  Goal status: INITIAL  2.  *** Baseline:  Goal status: INITIAL  3.  *** Baseline:  Goal status: INITIAL  4.  *** Baseline:  Goal status: INITIAL  5.  *** Baseline:  Goal status: INITIAL  6.  *** Baseline:  Goal status: INITIAL  PLAN:  PT FREQUENCY: {rehab frequency:25116}  PT DURATION: {rehab duration:25117}  PLANNED INTERVENTIONS: {rehab planned interventions:25118::"97110-Therapeutic exercises","97530- Therapeutic [phone]- Neuromuscular re-education","97535- Self FAOZ","30865- Manual therapy"}  PLAN FOR NEXT SESSION: ***   Jaret Coppedge, PT 08/23/2023, 2:43 PM

## 2023-08-23 NOTE — Patient Instructions (Signed)
 No ibuprofen while on prednisone.  Tylenol ok and flexeril  If not improving/worse, let me know

## 2023-08-23 NOTE — Progress Notes (Signed)
 Subjective:     Patient ID: Christine Holland, female    DOB: 1986-05-18, 38 y.o.   MRN: [id number]  Chief Complaint  Patient presents with   Pain    Pain in leg left, constant    HPI Discussed the use of AI scribe software for clinical note transcription with the patient, who gave verbal consent to proceed.  History of Present Illness   The patient is a 38 year old who presents with left leg pain and numbness following a myomectomy. She is accompanied   She has been experiencing left leg pain and numbness following a myomectomy on May 24, 2023. The symptoms began in January 2025, approximately two weeks post-surgery, with numbness and tingling in the left leg, particularly after walking. By February, the numbness was accompanied by intermittent heat sensations from the lower abdomen radiating down the leg. In March, the numbness progressed to pain, originating from the groin and sometimes from the back, radiating down the left leg to the knee. The pain is intermitt, severe, and affects her ability to walk and sleep, causing limping after extended walking. She has been taking ibuprofen 800 mg twice daily for 2 days and Tylenol PM for sleep, but the pain persists. No swelling or pain in the calf. Her grandmother expressed concern about a clot due to a family member w/ similar experience, but she has not exhibited symptoms consistent with this. She has been in contact with her gynecologist for post-surgical care yesterday.  PT was ordered and has appt tomorrow.  She reports back pain that began after her myomectomy, initially intermittent and manageable, but progressively worsening over time. No history of back problems prior to the surgery and no specific injury. She experiences episodes of stabbing pain from the neck to the back of the ear, which occurred on Sunday night and lasted until Monday morning.  She reports tenderness in the abdomen, particularly in the groin area, consistent  with the location of previous surgical incisions. The tenderness is not exacerbated by palpation, and there is no indication of infection. The tenderness may be related to the healing process post-myomectomy.  She is planning to return to the gym and is interested in weight loss, especially in preparation for an upcoming wedding.       There are no preventive care reminders to display for this patient.  Past Medical History:  Diagnosis Date   Alpha-thalassemia (HCC) 2016   (pt is a carrier)  previouly followed by  dr Cyndie Chime then followed by dr Estill Cotta note w/ sarah cinciniti NP in epic 07-03-2014   Anemia    Bilateral fibroadenomas of breasts 2008   GERD (gastroesophageal reflux disease)    Pancytopenia (HCC) 2013   mild,  dx 2013   Pre-diabetes    Sickle cell trait (HCC)    Uterine leiomyoma    Wears glasses     Past Surgical History:  Procedure Laterality Date   BREAST LUMPECTOMY Bilateral 2008   in [place]   MYOMECTOMY N/A 05/24/2023   Procedure: ABDOMINAL MYOMECTOMY;  Surgeon: Rosalyn Gess, MD;  Location: [facility];  Service: Gynecology;  Laterality: N/A;     Current Outpatient Medications:    cyclobenzaprine (FLEXERIL) 10 MG tablet, Take 1 tablet (10 mg total) by mouth 3 (three) times daily as needed for muscle spasms., Disp: 30 tablet, Rfl: 0   ibuprofen (ADVIL) 800 MG tablet, Take 1 tablet (800 mg total) by mouth every 8 (eight) hours as needed  for up to 42 doses., Disp: 42 tablet, Rfl: 0   predniSONE (DELTASONE) 20 MG tablet, Take 2 tablets (40 mg total) by mouth daily with breakfast for 5 days., Disp: 10 tablet, Rfl: 0  Allergies  Allergen Reactions   Other Anaphylaxis, Shortness Of Breath, Itching, Swelling and Other (See Comments)    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   Walnut Anaphylaxis, Shortness Of Breath, Itching, Swelling and Other (See Comments)    PER PATIENT: "ALL TREE NUTS" cause trouble breathing, throat swelling, itching   Apple  Itching and Other (See Comments)    Mouth, throat, lips- ITCH   Pineapple Itching and Other (See Comments)    Mouth, throat, lips- ITCH   ROS neg/noncontributory except as noted HPI/below      Objective:     BP 112/76   Pulse 85   Temp 98.1 F (36.7 C) (Temporal)   Resp 16   Ht 5' 8.5" (1.74 m)   Wt 232 lb 6 oz (105.4 kg)   LMP 07/30/2023 (Approximate)   SpO2 95%   BMI 34.82 kg/m  Wt Readings from Last 3 Encounters:  08/23/23 232 lb 6 oz (105.4 kg)  08/22/23 235 lb (106.6 kg)  07/04/23 226 lb (102.5 kg)    Physical Exam   Gen: WDWN NAD HEENT: NCAT, conjunctiva not injected, sclera nonicteric TM WNL B, OP moist, no exudates  NECK:  supple, no thyromegaly, no nodes, no carotid bruits ABDOMEN:  BS+, soft, mildly tender lower abd, No HSM, no masses EXT:  no edema MSK: no gross abnormalities.  NEURO: A&O x3.  CN II-XII intact.  PSYCH: normal mood. Good eye contact Back:  can stand on heels/toes/1 leg but pain L upper thigh when lift L leg.  No TTP. No SI tenderness. No rash.  MS 5/5 BLE.  DTR 1+ BLE.  SLR neg B but some pain above L knee w/extending L knee.  Good ROM   Spent 35 minutes-answering questions, reviewing gyn note     Assessment & Plan:  Sciatica of left side  Other orders -     predniSONE; Take 2 tablets (40 mg total) by mouth daily with breakfast for 5 days.  Dispense: 10 tablet; Refill: 0  Assessment and Plan    Left Leg Pain and Numbness   Left leg pain and numbness began in January after a myomectomy in December. Initially tingling and numbness, it has progressed to severe pain radiating from groin to knee, affecting ambulation and sleep. Suspected nerve impingement, possibly sciatica, though its relation to surgery is uncertain. Currently using ibuprofen and Tylenol PM for relief. Educated that frequently, nerve pain improves with physical therapy; MRI not indicated until conservative treatments fail as no red flags. Refer to physical therapy and  prescribe prednisone for 5 days. Discontinue ibuprofen while on prednisone. Allow use of Tylenol and muscle relaxer as needed. Reassess if symptoms worsen or do not improve with physical therapy.  Abdominal Tenderness   Abdominal tenderness possibly related to recent myomectomy. Tenderness noted on exam but does not exacerbate leg pain. Likely post-surgical healing given recent procedure. Advise to report any worsening symptoms and had f/u w/gyn.  Neck and Ear Pain   Sudden onset of stabbing pain from neck to back of ear, resolved by next morning. Likely due to pinched nerve causing transient pain. Monitor for recurrence.  General Health Maintenance   Plans to return to gym and interested in weight loss for upcoming wedding. Encouraged to maintain physical activity within current  limitations and focus on diet. Encourage upper body exercises and dietary modifications while recovering from leg pain.        Return if symptoms worsen or fail to improve.  Angelena Sole, MD

## 2023-08-24 ENCOUNTER — Ambulatory Visit: Attending: Obstetrics and Gynecology | Admitting: Rehabilitative and Restorative Service Providers"

## 2023-08-24 ENCOUNTER — Encounter: Payer: Self-pay | Admitting: Rehabilitative and Restorative Service Providers"

## 2023-08-24 ENCOUNTER — Ambulatory Visit: Admitting: Physical Therapy

## 2023-08-24 ENCOUNTER — Other Ambulatory Visit: Payer: Self-pay

## 2023-08-24 DIAGNOSIS — M5442 Lumbago with sciatica, left side: Secondary | ICD-10-CM | POA: Diagnosis present

## 2023-08-24 DIAGNOSIS — R262 Difficulty in walking, not elsewhere classified: Secondary | ICD-10-CM | POA: Diagnosis present

## 2023-08-24 DIAGNOSIS — M6281 Muscle weakness (generalized): Secondary | ICD-10-CM | POA: Insufficient documentation

## 2023-08-24 DIAGNOSIS — R2689 Other abnormalities of gait and mobility: Secondary | ICD-10-CM | POA: Diagnosis present

## 2023-08-24 DIAGNOSIS — R252 Cramp and spasm: Secondary | ICD-10-CM | POA: Diagnosis present

## 2023-08-24 NOTE — Therapy (Signed)
 OUTPATIENT PHYSICAL THERAPY LOWER EXTREMITY EVALUATION   Patient Name: Christine Holland MRN: [id number] DOB:06/25/1985, 38 y.o., female Today's Date: 08/24/2023  END OF SESSION:  PT End of Session - 08/24/23 1114     Visit Number 1    Date for PT Re-Evaluation 10/14/23    Authorization Type [employer]    PT Start Time 1108    PT Stop Time 1145    PT Time Calculation (min) 37 min    Activity Tolerance Patient limited by pain    Behavior During Therapy [facility] for tasks assessed/performed             Past Medical History:  Diagnosis Date   Alpha-thalassemia (HCC) 2016   (pt is a carrier)  previouly followed by  dr Cyndie Chime then followed by dr Estill Cotta note w/ sarah cinciniti NP in epic 07-03-2014   Anemia    Bilateral fibroadenomas of breasts 2008   GERD (gastroesophageal reflux disease)    Pancytopenia (HCC) 2013   mild,  dx 2013   Pre-diabetes    Sickle cell trait (HCC)    Uterine leiomyoma    Wears glasses    Past Surgical History:  Procedure Laterality Date   BREAST LUMPECTOMY Bilateral 2008   in [place]   MYOMECTOMY N/A 05/24/2023   Procedure: ABDOMINAL MYOMECTOMY;  Surgeon: Rosalyn Gess, MD;  Location: [facility];  Service: Gynecology;  Laterality: N/A;   Patient Active Problem List   Diagnosis Date Noted   Postoperative anemia 05/25/2023   Uterine leiomyoma 05/24/2023   Fibroids 04/13/2023   FIBROADENOMA, BREAST 11/16/2009   ANA POSITIVE 10/05/2009   URINALYSIS, ABNORMAL 07/29/2009   SICKLE-CELL TRAIT 07/06/2009   OTHER NEUTROPENIA 07/06/2009   BREAST TENDERNESS 06/10/2009   BREAST MASS, RIGHT 06/10/2009    PCP: Jeani Sow, MD  REFERRING PROVIDER: Rosalyn Gess, MD  REFERRING DIAG: [phone] (ICD-10-CM) - Left-sided low back pain with left-sided sciatica, unspecified chronicity  THERAPY DIAG:  Acute left-sided low back pain with left-sided sciatica  Cramp and spasm  Muscle weakness (generalized)  Difficulty in  walking, not elsewhere classified  Other abnormalities of gait and mobility  Rationale for Evaluation and Treatment: Rehabilitation  ONSET DATE: January 2025  SUBJECTIVE:   SUBJECTIVE STATEMENT: Patient reports that her pain started after her Myomectomy.  In January, it started with slight pain, but has progressively gotten worse since that time.  She states that she is having difficulty walking and has a limp now.  Patient is on a 5 day dose of Prednisone and states that it has helped some.    PERTINENT HISTORY: Abdominal Myomectomy, bilateral breast lumpectomy, Alpha-thalassemia carrier, pre-diabetes  PAIN:  Are you having pain? Yes: NPRS scale: 5-10/10 Pain location: Left low back radiating down left leg Pain description: sometimes sharp, sometimes spasm, sometimes a pull Aggravating factors: unknown Relieving factors: Medication  PRECAUTIONS: None  RED FLAGS: None   WEIGHT BEARING RESTRICTIONS: No  FALLS:  Has patient fallen in last 6 months? No  LIVING ENVIRONMENT: Lives with: lives with their family Lives in: House/apartment Stairs:  one story Has following equipment at home: None  OCCUPATION: Administrator, [employer]  PLOF: Independent and Leisure: traveling, walking outside, listening to music, movies  PATIENT GOALS: To no longer have pain.  NEXT MD VISIT: Dr Kennith Center 09/05/2023  OBJECTIVE:  Note: Objective measures were completed at Evaluation unless otherwise noted.  DIAGNOSTIC FINDINGS: N/A  PATIENT SURVEYS:  Eval:  LEFS 22 / 80 = 27.5 %  COGNITION: Overall cognitive status: Within functional limits for tasks assessed     SENSATION: Patient reports occasional numbness and tingling down left side   MUSCLE LENGTH: Hamstrings: tightness bilaterally  PALPATION: Able to palpate muscle spasms left lumbar and down left leg  LOWER EXTREMITY ROM:  WFL, but with pain on left leg  LOWER EXTREMITY MMT:  Eval: Right LE is WFL Left hip strength if 4/5  (limited by pain), left quad strength if 5-/5  LOWER EXTREMITY SPECIAL TESTS:  Eval: Slump test: positive on left  FUNCTIONAL TESTS:  Eval: 5 times sit to stand: 21.73 sec Timed up and go (TUG): 11.37 sec   GAIT: Distance walked: >200 ft Assistive device utilized: None Level of assistance: Complete Independence Comments: Antalgic gait with decreased weight bearing through left LE.                                                                                                                                TODAY'S TREATMENT: DATE: 08/24/2023  Seated sciatic nerve tensioner x10 left leg Seated hamstring stretch x20 sec bilat Seated piriformis stretch x20 sec bilat Review of HEP and provided handout   PATIENT EDUCATION:  Education details: Issued HEP Person educated: Patient and mother Education method: Explanation, Demonstration, and Handouts Education comprehension: verbalized understanding and returned demonstration  HOME EXERCISE PROGRAM: Access Code: [email or web address] URL: [email or web address] Date: 08/24/2023 Prepared by: Clydie Braun Kewon Statler  Exercises - Seated Hamstring Stretch  - 1 x daily - 7 x weekly - 2 reps - 20 sec hold - Seated Piriformis Stretch with Trunk Bend  - 1 x daily - 7 x weekly - 2 reps - 20 sec hold - Seated Sciatic Tensioner  - 1 x daily - 7 x weekly - 2 sets - 10 reps - Supine Lower Trunk Rotation  - 1 x daily - 7 x weekly - 1-2 sets - 10 reps - Supine Hip Adductor Stretch  - 1 x daily - 7 x weekly - 1 sets - 2-5 reps - 45 hold - Supine Diaphragmatic Breathing  - 1 x daily - 7 x weekly - 1-2 sets - 10 reps  ASSESSMENT:  CLINICAL IMPRESSION: Patient is a 38 y.o. female who was seen today for physical therapy evaluation and treatment for left leg sciatica. Patient was in her usual health and able to ambulate without pain, however, after her abdominal myomectomy on 05/24/2023, she started noticing some increased leg pain starting in January 2025.   Patient has had increasing pain since that time.  Patient reports that her pain has improved since starting Prednisone dose pack and muscle relaxants, but she is still having some pain.  Patient presents with increased pain, muscle weakness, difficulty walking, muscle spasms, and decreased flexibility.  Patient would benefit from skilled PT to progress towards her goal related activities to allow her to return to her pain-free lifestyle.  OBJECTIVE IMPAIRMENTS: decreased balance, difficulty walking, decreased strength, increased muscle spasms,  impaired flexibility, impaired sensation, and pain.   ACTIVITY LIMITATIONS: carrying, lifting, bending, standing, squatting, sleeping, and stairs  PARTICIPATION LIMITATIONS: cleaning, laundry, driving, and community activity  PERSONAL FACTORS: Time since onset of injury/illness/exacerbation and 3+ comorbidities: Abdominal Myomectomy, bilateral breast lumpectomy, Alpha-thalassemia carrier, pre-diabetes  are also affecting patient's functional outcome.   REHAB POTENTIAL: Good  CLINICAL DECISION MAKING: Evolving/moderate complexity  EVALUATION COMPLEXITY: Moderate   GOALS: Goals reviewed with patient? Yes  SHORT TERM GOALS: Target date: 09/16/2023 Patient will be independent with initial HEP. Baseline: Goal status: INITIAL  2.  Patient will report at least a 25% improvement in symptoms since starting PT. Baseline:  Goal status: INITIAL   LONG TERM GOALS: Target date: 10/14/2023  Patient will be independent with advanced HEP to allow for self progression after discharge. Baseline:  Goal status: INITIAL  2.  Patient will increase Lower Extremity Functional Scale to greater than 55% to demonstrate improvements in functional mobility. Baseline: 27.5% Goal status: INITIAL  3.  Patient to report ability to ambulate for at least 20 minutes without increased pain to allow for community ambulation. Baseline:  Goal status: INITIAL  4.  Patient to  increase left hip strength to [facility] to allow patient to perform tasks like stairs and more strenuous tasks with improved ease. Baseline:  Goal status: INITIAL  5.  Patient to improve 5 times sit to/from stand to no more than 12 seconds to demonstrate improved functional strength. Baseline: 21.73 sec Goal status: INITIAL    PLAN:  PT FREQUENCY: 2x/week  PT DURATION: 8 weeks  PLANNED INTERVENTIONS: 97164- PT Re-evaluation, 97110-Therapeutic exercises, 97530- Therapeutic activity, 97112- Neuromuscular re-education, 97535- Self Care, 47829- Manual therapy, [phone]- Gait training, [phone]- Aquatic Therapy, [phone]- Electrical stimulation (unattended), [phone]- Electrical stimulation (manual), 97016- Vasopneumatic device, [id number]- Ultrasound, [id number]- Traction (mechanical), [id number]- Ionotophoresis 4mg /ml Dexamethasone, Patient/Family education, Balance training, Stair training, Taping, Dry Needling, Joint mobilization, Joint manipulation, Spinal manipulation, Spinal mobilization, Scar mobilization, Cryotherapy, and Moist heat  PLAN FOR NEXT SESSION: Assess and progress HEP as indicated, strengthening, flexibility, manual/dry needling as indicated    Reather Laurence, PT, DPT 08/24/23, 11:15 AM  [facility] [address], [address] [place], [place] [address] Phone # [phone] Fax [phone]

## 2023-08-29 ENCOUNTER — Telehealth: Admitting: Nurse Practitioner

## 2023-08-29 ENCOUNTER — Telehealth: Admitting: Physician Assistant

## 2023-08-29 DIAGNOSIS — K219 Gastro-esophageal reflux disease without esophagitis: Secondary | ICD-10-CM | POA: Diagnosis not present

## 2023-08-29 DIAGNOSIS — H109 Unspecified conjunctivitis: Secondary | ICD-10-CM

## 2023-08-29 DIAGNOSIS — M542 Cervicalgia: Secondary | ICD-10-CM

## 2023-08-29 DIAGNOSIS — J029 Acute pharyngitis, unspecified: Secondary | ICD-10-CM | POA: Diagnosis not present

## 2023-08-29 DIAGNOSIS — B9689 Other specified bacterial agents as the cause of diseases classified elsewhere: Secondary | ICD-10-CM

## 2023-08-29 DIAGNOSIS — J069 Acute upper respiratory infection, unspecified: Secondary | ICD-10-CM

## 2023-08-29 DIAGNOSIS — H1033 Unspecified acute conjunctivitis, bilateral: Secondary | ICD-10-CM

## 2023-08-29 MED ORDER — CETIRIZINE HCL 10 MG PO TABS: 10.0000 mg | ORAL_TABLET | Freq: Every day | ORAL | 0 refills | Status: DC

## 2023-08-29 MED ORDER — OFLOXACIN 0.3 % OP SOLN: 1.0000 [drp] | Freq: Four times a day (QID) | OPHTHALMIC | 0 refills | Status: AC

## 2023-08-29 MED ORDER — LIDOCAINE VISCOUS HCL 2 % MT SOLN: 5.0000 mL | Freq: Four times a day (QID) | OROMUCOSAL | 0 refills | Status: DC | PRN

## 2023-08-29 MED ORDER — FAMOTIDINE 20 MG PO TABS: 20.0000 mg | ORAL_TABLET | Freq: Two times a day (BID) | ORAL | 0 refills | Status: DC

## 2023-08-29 NOTE — Progress Notes (Signed)
   Thank you for the details you included in the comment boxes. Those details are very helpful in determining the best course of treatment for you and help [place] to provide the best care.Because of multiple issues needing addressed, we recommend that you schedule a Virtual Urgent Care video visit in order for the provider to better assess what is going on.  The provider will be able to give you a more accurate diagnosis and treatment plan if we can more freely discuss your symptoms and with the addition of a virtual examination.   If you change your visit to a video visit, we will bill your insurance (similar to an office visit) and you will not be charged for this e-Visit. You will be able to stay at home and speak with the first available [facility] advanced practice provider. The link to do a video visit is in the drop down Menu tab of your Welcome screen in MyChart.

## 2023-08-29 NOTE — Progress Notes (Signed)
 Virtual Visit Consent   Christine Holland, you are scheduled for a virtual visit with a [facility] provider today. Just as with appointments in the office, your consent must be obtained to participate. Your consent will be active for this visit and any virtual visit you may have with one of our providers in the next 365 days. If you have a MyChart account, a copy of this consent can be sent to you electronically.  As this is a virtual visit, video technology does not allow for your provider to perform a traditional examination. This may limit your provider's ability to fully assess your condition. If your provider identifies any concerns that need to be evaluated in person or the need to arrange testing (such as labs, EKG, etc.), we will make arrangements to do so. Although advances in technology are sophisticated, we cannot ensure that it will always work on either your end or our end. If the connection with a video visit is poor, the visit may have to be switched to a telephone visit. With either a video or telephone visit, we are not always able to ensure that we have a secure connection.  By engaging in this virtual visit, you consent to the provision of healthcare and authorize for your insurance to be billed (if applicable) for the services provided during this visit. Depending on your insurance coverage, you may receive a charge related to this service.  I need to obtain your verbal consent now. Are you willing to proceed with your visit today? Valda Christenson has provided verbal consent on 08/29/2023 for a virtual visit (video or telephone). Viviano Simas, FNP  Date: 08/29/2023 4:44 PM   Virtual Visit via Video Note   I, Viviano Simas, connected with  Leidi Astle  ([id number], Apr 16, 1986) on 08/29/23 at  4:45 PM EDT by a video-enabled telemedicine application and verified that I am speaking with the correct person using two identifiers.  Location: Patient: Virtual Visit Location  Patient: Home Provider: Virtual Visit Location Provider: Home Office   I discussed the limitations of evaluation and management by telemedicine and the availability of in person appointments. The patient expressed understanding and agreed to proceed.    History of Present Illness: Christine Holland is a 38 y.o. who identifies as a female who was assigned female at birth, and is being seen today for redness and puffiness in her eyes that have been itchy for the past 2 days   She did have some white discharge today from her eyes    Last week she had a sore throat and ear pain- was seen at PCP and told URI  Last week she had some sinus congestion that has resolved  Denies runny nose   Last night she had some acid reflux that has caused her to have a return of her sore throat today   Denies a fever  Denies a cough      Problems:  Patient Active Problem List   Diagnosis Date Noted   Postoperative anemia 05/25/2023   Uterine leiomyoma 05/24/2023   Fibroids 04/13/2023   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   Sore throat 01/20/2016   FIBROADENOMA, 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/16/2009   ANA POSITIVE 10/05/2009   URINALYSIS, ABNORMAL 07/29/2009   SICKLE-CELL TRAIT 07/06/2009   OTHER NEUTROPENIA 07/06/2009   BREAST TENDERNESS 06/10/2009   BREAST MASS, RIGHT 06/10/2009    Allergies:  Allergies  Allergen Reactions   Other Anaphylaxis, Shortness Of Breath, Itching, Swelling and  Other (See Comments)    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   Walnut Anaphylaxis, Shortness Of Breath, Itching, Swelling and Other (See Comments)    PER PATIENT: "ALL TREE NUTS" cause trouble breathing, throat swelling, itching   Apple Itching and Other (See Comments)    Mouth, throat, lips- ITCH   Pineapple Itching and Other (See Comments)    Mouth, throat, lips- ITCH   Medications:  Current Outpatient Medications:    cyclobenzaprine (FLEXERIL) 10 MG tablet, Take 1 tablet (10 mg total) by mouth 3  (three) times daily as needed for muscle spasms., Disp: 30 tablet, Rfl: 0   ibuprofen (ADVIL) 800 MG tablet, Take 1 tablet (800 mg total) by mouth every 8 (eight) hours as needed for up to 42 doses., Disp: 42 tablet, Rfl: 0  Observations/Objective: Patient is well-developed, well-nourished in no acute distress.  Resting comfortably  at home.  Head is normocephalic, atraumatic.  No labored breathing.  Speech is clear and coherent with logical content.  Patient is alert and oriented at baseline.    Assessment and Plan:   1. Bacterial conjunctivitis of both eyes (Primary)  - ofloxacin (OCUFLOX) 0.3 % ophthalmic solution; Place 1 drop into both eyes 4 (four) times daily for 5 days.  Dispense: 5 mL; Refill: 0  2. Gastroesophageal reflux disease without esophagitis  - famotidine (PEPCID) 20 MG tablet; Take 1 tablet (20 mg total) by mouth 2 (two) times daily for 7 days.  Dispense: 14 tablet; Refill: 0  3. Pharyngitis, unspecified etiology  - cetirizine (ZYRTEC ALLERGY) 10 MG tablet; Take 1 tablet (10 mg total) by mouth at bedtime.  Dispense: 30 tablet; Refill: 0 - magic mouthwash (lidocaine, diphenhydrAMINE, alum & mag hydroxide) suspension; Swish and spit 5 mLs 4 (four) times daily as needed (gargle and spit). 1:1:1 ratio  Dispense: 360 mL; Refill: 0     Follow Up Instructions: I discussed the assessment and treatment plan with the patient. The patient was provided an opportunity to ask questions and all were answered. The patient agreed with the plan and demonstrated an understanding of the instructions.  A copy of instructions were sent to the patient via MyChart unless otherwise noted below.    The patient was advised to call back or seek an in-person evaluation if the symptoms worsen or if the condition fails to improve as anticipated.    Viviano Simas, FNP

## 2023-08-31 ENCOUNTER — Encounter: Payer: Self-pay | Admitting: Family Medicine

## 2023-08-31 ENCOUNTER — Ambulatory Visit

## 2023-08-31 ENCOUNTER — Ambulatory Visit: Admitting: Family Medicine

## 2023-08-31 VITALS — BP 109/70 | HR 111 | Temp 97.4°F | Resp 18 | Ht 68.5 in | Wt 227.2 lb

## 2023-08-31 DIAGNOSIS — R059 Cough, unspecified: Secondary | ICD-10-CM

## 2023-08-31 DIAGNOSIS — J029 Acute pharyngitis, unspecified: Secondary | ICD-10-CM | POA: Diagnosis not present

## 2023-08-31 DIAGNOSIS — R519 Headache, unspecified: Secondary | ICD-10-CM | POA: Diagnosis not present

## 2023-08-31 LAB — POC COVID19 BINAXNOW: SARS Coronavirus 2 Ag: NEGATIVE

## 2023-08-31 LAB — POCT INFLUENZA A/B
Influenza A, POC: NEGATIVE
Influenza B, POC: NEGATIVE

## 2023-08-31 LAB — POCT RAPID STREP A (OFFICE): Rapid Strep A Screen: NEGATIVE

## 2023-08-31 MED ORDER — AMOXICILLIN 500 MG PO CAPS: 500.0000 mg | ORAL_CAPSULE | Freq: Three times a day (TID) | ORAL | 0 refills | Status: AC

## 2023-08-31 MED ORDER — LIDOCAINE VISCOUS HCL 2 % MT SOLN: 5.0000 mL | Freq: Four times a day (QID) | OROMUCOSAL | 0 refills | Status: DC | PRN

## 2023-08-31 MED ORDER — PREDNISONE 20 MG PO TABS: 40.0000 mg | ORAL_TABLET | Freq: Every day | ORAL | 0 refills | Status: AC

## 2023-08-31 NOTE — Patient Instructions (Signed)
 Can stop prednisone at any time  Worse, not improving let me know

## 2023-08-31 NOTE — Progress Notes (Signed)
 Subjective:     Patient ID: Christine Holland, female    DOB: 1986-01-01, 38 y.o.   MRN: [id number]  Chief Complaint  Patient presents with   Sore Throat    Started last Thursday, burning sensation   Eye Problem    Bilateral eye swelling started Sunday, eye discharge on Monday   Headache   Cough    Sx started last week Seen at [facility], prescribed medications    HPI Discussed the use of AI scribe software for clinical note transcription with the patient, who gave verbal consent to proceed.  History of Present Illness   Christine Holland is a 38 year old female who presents with severe throat pain and ear discomfort. She is accompanied by her mother.  She has been experiencing severe throat pain since Sunday night, August 28, 2023. The pain is described as a burning sensation, severe enough to cause difficulty swallowing, eating, and drinking. Despite a history of high pain tolerance, she finds this pain intense enough to cause tears when taking ibuprofen. She has been taking famotidine for throat discomfort since Monday, but has not been able to obtain 'magic mouthwash' due to availability issues at local pharmacies.  She reports ear discomfort, describing a sharp, stabbing pain that radiates to the back of both ears. This pain began after an initial episode of left ear pain the previous Sunday, which resolved by Monday. However, by Thursday, she developed a sore throat, which worsened by Sunday. The pain radiates and is accompanied by a sensation of being stabbed in the back of the ears.  She has been experiencing eye symptoms, including swelling and discharge, which were diagnosed as conjunctivitis. She was prescribed Ocuflox eye drops, which she has been using four times a day since Monday afternoon. She reports some improvement, as she no longer feels a 'sand' sensation in her eyes, although they remain watery.  She experiences congestion, particularly at night, which causes her to  sleep with her mouth open, leading to dryness and worsening of symptoms. She had a low-grade fever of 101.53F on Monday. No body aches, vomiting, diarrhea, or significant coughing, except when trying to clear her throat.  She is undergoing physical therapy for sciatica, which initially helped alleviate pain from her knee to her calf. She is also took prednisone, which, along with physical therapy, has provided some relief.        There are no preventive care reminders to display for this patient.  Past Medical History:  Diagnosis Date   Alpha-thalassemia (HCC) 2016   (pt is a carrier)  previouly followed by  dr Cyndie Chime then followed by dr Estill Cotta note w/ sarah cinciniti NP in epic 07-03-2014   Anemia    Bilateral fibroadenomas of breasts 2008   GERD (gastroesophageal reflux disease)    Pancytopenia (HCC) 2013   mild,  dx 2013   Pre-diabetes    Sickle cell trait (HCC)    Uterine leiomyoma    Wears glasses     Past Surgical History:  Procedure Laterality Date   BREAST LUMPECTOMY Bilateral 2008   in [place]   MYOMECTOMY N/A 05/24/2023   Procedure: ABDOMINAL MYOMECTOMY;  Surgeon: Rosalyn Gess, MD;  Location: [facility];  Service: Gynecology;  Laterality: N/A;     Current Outpatient Medications:    amoxicillin (AMOXIL) 500 MG capsule, Take 1 capsule (500 mg total) by mouth 3 (three) times daily for 10 days., Disp: 30 capsule, Rfl: 0   cetirizine (  ZYRTEC ALLERGY) 10 MG tablet, Take 1 tablet (10 mg total) by mouth at bedtime., Disp: 30 tablet, Rfl: 0   cyclobenzaprine (FLEXERIL) 10 MG tablet, Take 1 tablet (10 mg total) by mouth 3 (three) times daily as needed for muscle spasms., Disp: 30 tablet, Rfl: 0   famotidine (PEPCID) 20 MG tablet, Take 1 tablet (20 mg total) by mouth 2 (two) times daily for 7 days., Disp: 14 tablet, Rfl: 0   ibuprofen (ADVIL) 800 MG tablet, Take 1 tablet (800 mg total) by mouth every 8 (eight) hours as needed for up to 42 doses., Disp:  42 tablet, Rfl: 0   ofloxacin (OCUFLOX) 0.3 % ophthalmic solution, Place 1 drop into both eyes 4 (four) times daily for 5 days., Disp: 5 mL, Rfl: 0   predniSONE (DELTASONE) 20 MG tablet, Take 2 tablets (40 mg total) by mouth daily with breakfast for 5 days., Disp: 10 tablet, Rfl: 0   magic mouthwash (lidocaine, diphenhydrAMINE, alum & mag hydroxide) suspension, Swish and spit 5 mLs 4 (four) times daily as needed (gargle and spit). 1:1:1 ratio, Disp: 360 mL, Rfl: 0  Allergies  Allergen Reactions   Other Anaphylaxis, Shortness Of Breath, Itching, Swelling and Other (See Comments)    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   Walnut Anaphylaxis, Shortness Of Breath, Itching, Swelling and Other (See Comments)    PER PATIENT: "ALL TREE NUTS" cause trouble breathing, throat swelling, itching   Apple Itching and Other (See Comments)    Mouth, throat, lips- ITCH   Pineapple Itching and Other (See Comments)    Mouth, throat, lips- ITCH   ROS neg/noncontributory except as noted HPI/below      Objective:     BP 109/70   Pulse (!) 111   Temp (!) 97.4 F (36.3 C) (Temporal)   Resp 18   Ht 5' 8.5" (1.74 m)   Wt 227 lb 4 oz (103.1 kg)   LMP 07/30/2023 (Approximate)   SpO2 96%   BMI 34.05 kg/m  Wt Readings from Last 3 Encounters:  08/31/23 227 lb 4 oz (103.1 kg)  08/23/23 232 lb 6 oz (105.4 kg)  08/22/23 235 lb (106.6 kg)    Physical Exam   Gen: WDWN NAD HEENT: NCAT, conjunctiva B injected, sclera nonicteric TM WNL B, OP moist, no exudates  but very red.  Very congested.  No sinus tenderness.   NECK:  supple, no thyromegaly, + tender submand nodes CARDIAC: RRR, S1S2+, no murmur.  LUNGS: CTAB. No wheezes ABDOMEN:  BS+, soft,mildly tender lower abd, No HSM, no masses EXT:  no edema MSK: no gross abnormalities.  NEURO: A&O x3.  CN II-XII intact.  PSYCH: normal mood. Good eye contact TTP neck  Results for orders placed or performed in visit on 08/31/23  POC COVID-19   Collection Time: 08/31/23  11:28 AM  Result Value Ref Range   SARS Coronavirus 2 Ag Negative Negative  POCT Influenza A/B   Collection Time: 08/31/23 11:28 AM  Result Value Ref Range   Influenza A, POC Negative Negative   Influenza B, POC Negative Negative  POCT rapid strep A   Collection Time: 08/31/23 11:28 AM  Result Value Ref Range   Rapid Strep A Screen Negative Negative        Assessment & Plan:  Sore throat -     POC COVID-19 BinaxNow -     POCT Influenza A/B -     POCT rapid strep A  Cough, unspecified type -  POC COVID-19 BinaxNow -     POCT Influenza A/B -     POCT rapid strep A  Pharyngitis, unspecified etiology -     magic mouthwash (lidocaine, diphenhydrAMINE, alum & mag hydroxide) suspension; Swish and spit 5 mLs 4 (four) times daily as needed (gargle and spit). 1:1:1 ratio  Dispense: 360 mL; Refill: 0  Nonintractable headache, unspecified chronicity pattern, unspecified headache type -     POC COVID-19 BinaxNow -     POCT Influenza A/B -     POCT rapid strep A  Other orders -     Amoxicillin; Take 1 capsule (500 mg total) by mouth 3 (three) times daily for 10 days.  Dispense: 30 capsule; Refill: 0 -     predniSONE; Take 2 tablets (40 mg total) by mouth daily with breakfast for 5 days.  Dispense: 10 tablet; Refill: 0  Assessment and Plan    Severe Pharyngitis   She has a severe sore throat with a burning sensation and difficulty swallowing since March 16, accompanied by hoarseness, inability to eat or drink, and severe pain. Differential diagnosis includes viral or bacterial pharyngitis, or reflux. Famotidine was prescribed for possible reflux-related symptoms. Tests for influenza, COVID-19, and streptococcal infection are negative.  will do amoxicillin 500mg  tid and prednisone, although they may not be effective if the cause is vial. Send prescription for magic mouthwash to [facility] or [facility].  Bilateral Ear Pain   She experiences sharp, stabbing pain in both ears  with radiation to the neck, possibly related to occipital nerve involvement. The pain is severe and has been present since March 16. This may be referred pain from pharyngitis or a separate ear condition.  Conjunctivitis   She has bilateral eye irritation with swelling and discharge, diagnosed as bacterial conjunctivitis. Ocuflox (ofloxacin) eye drops were prescribed and have been used for approximately 36 hours. Symptoms include watery eyes and irritation, but the sensation of sand in the eyes has improved. Continue Ocuflox eye drops as prescribed.  Allergies   She is experiencing congestion and other allergy-related symptoms due to seasonal changes.  Follow-up   Awaiting results of influenza, COVID-19, and streptococcal tests to determine further treatment. Antibiotic treatment will be considered based on test results. Await test results to determine further treatment.        Return if symptoms worsen or fail to improve.  Angelena Sole, MD

## 2023-09-01 ENCOUNTER — Encounter: Payer: Self-pay | Admitting: Obstetrics and Gynecology

## 2023-09-05 ENCOUNTER — Ambulatory Visit: Admitting: Obstetrics and Gynecology

## 2023-09-05 ENCOUNTER — Telehealth: Payer: Self-pay

## 2023-09-05 NOTE — Telephone Encounter (Signed)
 Left voice message for patient to return phone call.  Called to check to see how patient was doing since the last time we saw her. How is her leg pain? What's the plan for physical therapy? Does she plan to continue PT?   When does she plan to follow up with Dr. Kennith Center?

## 2023-09-07 ENCOUNTER — Ambulatory Visit

## 2023-09-09 ENCOUNTER — Encounter (HOSPITAL_BASED_OUTPATIENT_CLINIC_OR_DEPARTMENT_OTHER): Attending: Obstetrics and Gynecology | Admitting: Physical Therapy

## 2023-09-14 ENCOUNTER — Ambulatory Visit

## 2023-09-16 ENCOUNTER — Encounter (HOSPITAL_BASED_OUTPATIENT_CLINIC_OR_DEPARTMENT_OTHER): Admitting: Physical Therapy

## 2023-09-21 ENCOUNTER — Encounter

## 2023-09-23 ENCOUNTER — Ambulatory Visit (HOSPITAL_BASED_OUTPATIENT_CLINIC_OR_DEPARTMENT_OTHER): Admitting: Physical Therapy

## 2023-09-28 ENCOUNTER — Encounter

## 2023-09-29 ENCOUNTER — Ambulatory Visit (HOSPITAL_BASED_OUTPATIENT_CLINIC_OR_DEPARTMENT_OTHER): Admitting: Physical Therapy

## 2023-10-05 ENCOUNTER — Encounter

## 2023-10-07 ENCOUNTER — Ambulatory Visit (HOSPITAL_BASED_OUTPATIENT_CLINIC_OR_DEPARTMENT_OTHER): Admitting: Physical Therapy

## 2023-10-12 ENCOUNTER — Encounter

## 2023-10-14 ENCOUNTER — Ambulatory Visit (HOSPITAL_BASED_OUTPATIENT_CLINIC_OR_DEPARTMENT_OTHER): Admitting: Physical Therapy

## 2024-01-13 ENCOUNTER — Telehealth: Admitting: Physician Assistant

## 2024-01-13 ENCOUNTER — Encounter: Payer: Self-pay | Admitting: Family Medicine

## 2024-01-13 DIAGNOSIS — J352 Hypertrophy of adenoids: Secondary | ICD-10-CM

## 2024-01-13 NOTE — Progress Notes (Signed)
 [place]

## 2024-03-27 ENCOUNTER — Ambulatory Visit (INDEPENDENT_AMBULATORY_CARE_PROVIDER_SITE_OTHER): Payer: 59 | Admitting: Obstetrics and Gynecology

## 2024-03-27 ENCOUNTER — Encounter: Payer: Self-pay | Admitting: Obstetrics and Gynecology

## 2024-03-27 VITALS — BP 110/78 | HR 91 | Temp 98.2°F | Ht 69.0 in | Wt 222.4 lb

## 2024-03-27 DIAGNOSIS — Z01419 Encounter for gynecological examination (general) (routine) without abnormal findings: Secondary | ICD-10-CM | POA: Diagnosis not present

## 2024-03-27 DIAGNOSIS — Z1331 Encounter for screening for depression: Secondary | ICD-10-CM | POA: Diagnosis not present

## 2024-03-27 NOTE — Assessment & Plan Note (Signed)
 Cervical cancer screening performed according to ASCCP guidelines. Encouraged annual mammogram screening at age 38 Labs and immunizations with her primary Encouraged safe sexual practices as indicated Encouraged healthy lifestyle practices with diet and exercise For patients under 50yo, I recommend 1000mg  calcium daily and 600IU of vitamin D daily.

## 2024-03-27 NOTE — Progress Notes (Signed)
 38 y.o. G0P0000 female with h/o abdominal myomectomy (05/2023), bilateral fibroadenomas for annual exam.  Engaged, wedding moved to December 2025. 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 PCP: Wendolyn Jenkins Jansky, MD   Patient's last menstrual period was 03/09/2024 (exact date). Period Cycle (Days): 28 Period Duration (Days): 5 Period Pattern: Regular Menstrual Flow: Moderate Menstrual Control: Maxi pad Menstrual Control Change Freq (Hours): 5 Dysmenorrhea: (!) Mild Dysmenorrhea Symptoms: Cramping  She reports doing well, postop back pain resolved.  Patient went to 1 PT session and continue physical therapy at home.  She was also prescribed a prednisone  dose pack by her PCP.  She denies any ongoing symptoms.   Urine sample provided: none  Abnormal bleeding: none Pelvic discharge or pain: none Breast mass, nipple discharge or skin changes : none  Sexually active: no Birth control: abstinence  Gardasil: unsure, will check vaccine records Last PAP:     Component Value Date/Time   DIAGPAP  04/05/2022 1411    - Negative for Intraepithelial Lesions or Malignancy (NILM)   DIAGPAP - Benign reactive/reparative changes 04/05/2022 1411   HPVHIGH Negative 04/05/2022 1411   ADEQPAP  04/05/2022 1411    Satisfactory for evaluation; transformation zone component PRESENT.   Last mammogram: 05/31/22 BIRADS 2, density c  Exercising: walking, sometimes Smoker: no  Garment/textile technologist Visit from 03/27/2024 in [facility]  PHQ-2 Total Score 0    Flowsheet Row Office Visit from 04/05/2022 in [facility]  PHQ-9 Total Score 1     GYN HISTORY: Fibroids, ab myomectomy, 2024 Bilateral lumpectomy, 2006-2008, benign  OB History  Gravida Para Term Preterm AB Living  0 0 0 0 0 0  SAB IAB Ectopic Multiple Live Births  0 0 0 0 0   Past Medical History:  Diagnosis Date   Alpha-thalassemia 2016   (pt is a carrier)  previouly  followed by  dr freddie then followed by dr timmy ronco note w/ sarah cinciniti NP in epic 07-03-2014   Anemia    Bilateral fibroadenomas of breasts 2008   GERD (gastroesophageal reflux disease)    Pancytopenia (HCC) 2013   mild,  dx 2013   Pre-diabetes    Sickle cell trait    Uterine leiomyoma    Wears glasses    Past Surgical History:  Procedure Laterality Date   BREAST LUMPECTOMY Bilateral 2008   in [place]   MYOMECTOMY N/A 05/24/2023   Procedure: ABDOMINAL MYOMECTOMY;  Surgeon: Dallie Vera GAILS, MD;  Location: [facility];  Service: Gynecology;  Laterality: N/A;   Current Outpatient Medications on File Prior to Visit  Medication Sig Dispense Refill   ascorbic acid (VITAMIN C) 500 MG tablet Take 500 mg by mouth daily.     ibuprofen  (ADVIL ) 800 MG tablet Take 1 tablet (800 mg total) by mouth every 8 (eight) hours as needed for up to 42 doses. 42 tablet 0   Prenatal Multivit-Min-Fe-FA (PRE-NATAL PO) Take by mouth.     No current facility-administered medications on file prior to visit.   Social History   Socioeconomic History   Marital status: Single    Spouse name: Not on file   Number of children: 0   Years of education: Not on file   Highest education level: Master's degree (e.g., MA, MS, MEng, MEd, MSW, MBA)  Occupational History   Occupation: Administrator, Civil Service  Tobacco Use   Smoking status: Never    Passive exposure: Never   Smokeless  tobacco: Never  Vaping Use   Vaping status: Never Used  Substance and Sexual Activity   Alcohol use: Never   Drug use: Never   Sexual activity: Not Currently    Partners: Male    Birth control/protection: Abstinence  Other Topics Concern   Not on file  Social History Narrative   Marital status: single-engaged to marry July 2025;    From [place], [place]; [place]  since [place].      Children:  None      Lives: with parent/mom, brothers.      Employment:  Works for [employer]  in [employer]; bilingual Water quality scientist.       Tobacco: none      Alcohol: none      Drugs: none      Exercise:  [employer]      Sexual activity:  None; last sexual activity 2005; total partners = 1; no STDs.     Social Drivers of Health   Financial Resource Strain: Medium Risk (08/23/2023)   Overall Financial Resource Strain (CARDIA)    Difficulty of Paying Living Expenses: Somewhat hard  Food Insecurity: No Food Insecurity (08/23/2023)   Hunger Vital Sign    Worried About Running Out of Food in the Last Year: Never true    Ran Out of Food in the Last Year: Never true  Transportation Needs: No Transportation Needs (08/23/2023)   PRAPARE - Administrator, Civil Service (Medical): No    Lack of Transportation (Non-Medical): No  Physical Activity: Insufficiently Active (08/23/2023)   Exercise Vital Sign    Days of Exercise per Week: 2 days    Minutes of Exercise per Session: 30 min  Stress: No Stress Concern Present (08/23/2023)   [employer] of Occupational Health - Occupational Stress Questionnaire    Feeling of Stress : Only a little  Social Connections: Moderately Integrated (08/23/2023)   Social Connection and Isolation Panel    Frequency of Communication with Friends and Family: More than three times a week    Frequency of Social Gatherings with Friends and Family: Twice a week    Attends Religious Services: More than 4 times per year    Active Member of [employer] or Organizations: Yes    Attends Engineer, structural: More than 4 times per year    Marital Status: Never married  Intimate Partner Violence: Not At Risk (05/24/2023)   Humiliation, Afraid, Rape, and Kick questionnaire    Fear of Current or Ex-Partner: No    Emotionally Abused: No    Physically Abused: No    Sexually Abused: No   Family History  Problem Relation Age of Onset   Heart disease Mother 15   Hypertension Mother    Hypertension Father    Asthma Sister    Asthma Brother    Breast cancer Neg Hx    Allergies   Allergen Reactions   Other Anaphylaxis, Shortness Of Breath, Itching, Swelling and Other (See Comments)    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   Walnut Anaphylaxis, Shortness Of Breath, Itching, Swelling and Other (See Comments)    PER PATIENT: ALL TREE NUTS cause trouble breathing, throat swelling, itching   Apple Itching and Other (See Comments)    Mouth, throat, lips- ITCH   Pineapple Itching and Other (See Comments)    Mouth, throat, lips- ITCH     PE Today's Vitals   03/27/24 0823  BP: 110/78  Pulse: 91  Temp: 98.2 F (36.8 C)  TempSrc:  Oral  SpO2: 96%  Weight: 222 lb 6.4 oz (100.9 kg)  Height: 5' 9 (1.753 m)   Body mass index is 32.84 kg/m.  Physical Exam Vitals reviewed. Exam conducted with a chaperone present.  Constitutional:      General: She is not in acute distress.    Appearance: Normal appearance.  HENT:     Head: Normocephalic and atraumatic.     Nose: Nose normal.  Eyes:     Extraocular Movements: Extraocular movements intact.     Conjunctiva/sclera: Conjunctivae normal.  Neck:     Thyroid : No thyroid  mass, thyromegaly or thyroid  tenderness.  Pulmonary:     Effort: Pulmonary effort is normal.  Chest:     Chest wall: No mass or tenderness.  Breasts:    Right: Normal. No swelling, mass, nipple discharge, skin change or tenderness.     Left: Normal. No swelling, mass, nipple discharge, skin change or tenderness.  Abdominal:     General: There is no distension.     Palpations: Abdomen is soft.     Tenderness: There is no abdominal tenderness.   Genitourinary:    General: Normal vulva.     Exam position: Lithotomy position.     Urethra: No prolapse.     Vagina: Normal. No vaginal discharge or bleeding.     Cervix: Normal. No lesion.     Uterus: Normal. Not enlarged and not tender.      Adnexa: Right adnexa normal and left adnexa normal.  Musculoskeletal:        General: Normal range of motion.     Cervical back: Normal range of motion.   Lymphadenopathy:     Upper Body:     Right upper body: No axillary adenopathy.     Left upper body: No axillary adenopathy.     Lower Body: No right inguinal adenopathy. No left inguinal adenopathy.  Skin:    General: Skin is warm and dry.  Neurological:     General: No focal deficit present.     Mental Status: She is alert.  Psychiatric:        Mood and Affect: Mood normal.        Behavior: Behavior normal.      Assessment and Plan:        Well woman exam with routine gynecological exam Assessment & Plan: Cervical cancer screening performed according to ASCCP guidelines. Encouraged annual mammogram screening at age 20  Labs and immunizations with her primary Encouraged safe sexual practices as indicated Encouraged healthy lifestyle practices with diet and exercise For patients under 50yo, I recommend 1000mg  calcium daily and 600IU of vitamin D daily.    Negative depression screening   Vera LULLA Pa, MD

## 2024-03-27 NOTE — Patient Instructions (Signed)

## 2024-04-09 ENCOUNTER — Ambulatory Visit: Payer: Self-pay | Admitting: Family Medicine

## 2024-04-09 ENCOUNTER — Encounter: Payer: Self-pay | Admitting: Family Medicine

## 2024-04-09 ENCOUNTER — Other Ambulatory Visit (HOSPITAL_COMMUNITY): Payer: Self-pay

## 2024-04-09 ENCOUNTER — Ambulatory Visit: Payer: 59 | Admitting: Family Medicine

## 2024-04-09 VITALS — BP 124/78 | HR 88 | Temp 97.5°F | Ht 69.0 in | Wt 223.0 lb

## 2024-04-09 DIAGNOSIS — Z0000 Encounter for general adult medical examination without abnormal findings: Secondary | ICD-10-CM

## 2024-04-09 DIAGNOSIS — E6609 Other obesity due to excess calories: Secondary | ICD-10-CM | POA: Diagnosis not present

## 2024-04-09 DIAGNOSIS — Z6832 Body mass index (BMI) 32.0-32.9, adult: Secondary | ICD-10-CM

## 2024-04-09 DIAGNOSIS — E66811 Obesity, class 1: Secondary | ICD-10-CM

## 2024-04-09 DIAGNOSIS — Z131 Encounter for screening for diabetes mellitus: Secondary | ICD-10-CM | POA: Diagnosis not present

## 2024-04-09 DIAGNOSIS — Z13220 Encounter for screening for lipoid disorders: Secondary | ICD-10-CM

## 2024-04-09 DIAGNOSIS — Z23 Encounter for immunization: Secondary | ICD-10-CM | POA: Diagnosis not present

## 2024-04-09 DIAGNOSIS — Z7184 Encounter for health counseling related to travel: Secondary | ICD-10-CM | POA: Diagnosis not present

## 2024-04-09 LAB — LIPID PANEL
Cholesterol: 149 mg/dL (ref 0–200)
HDL: 30.4 mg/dL — ABNORMAL LOW (ref >39.00)
LDL Cholesterol: 99 mg/dL (ref 0–99)
NonHDL: 118.1
Total CHOL/HDL Ratio: 5
Triglycerides: 97 mg/dL (ref 0.0–149.0)
VLDL: 19.4 mg/dL (ref 0.0–40.0)

## 2024-04-09 LAB — CBC WITH DIFFERENTIAL/PLATELET
Basophils Absolute: 0 K/uL (ref 0.0–0.1)
Basophils Relative: 0.3 % (ref 0.0–3.0)
Eosinophils Absolute: 0 K/uL (ref 0.0–0.7)
Eosinophils Relative: 1.2 % (ref 0.0–5.0)
HCT: 34.1 % — ABNORMAL LOW (ref 36.0–46.0)
Hemoglobin: 11.1 g/dL — ABNORMAL LOW (ref 12.0–15.0)
Lymphocytes Relative: 49 % — ABNORMAL HIGH (ref 12.0–46.0)
Lymphs Abs: 1.9 K/uL (ref 0.7–4.0)
MCHC: 32.6 g/dL (ref 30.0–36.0)
MCV: 77.2 fl — ABNORMAL LOW (ref 78.0–100.0)
Monocytes Absolute: 0.4 K/uL (ref 0.1–1.0)
Monocytes Relative: 10.1 % (ref 3.0–12.0)
Neutro Abs: 1.6 K/uL (ref 1.4–7.7)
Neutrophils Relative %: 39.4 % — ABNORMAL LOW (ref 43.0–77.0)
Platelets: 153 K/uL (ref 150.0–400.0)
RBC: 4.41 Mil/uL (ref 3.87–5.11)
RDW: 13.9 % (ref 11.5–15.5)
WBC: 4 K/uL (ref 4.0–10.5)

## 2024-04-09 LAB — HEMOGLOBIN A1C: Hgb A1c MFr Bld: 6.2 % (ref 4.6–6.5)

## 2024-04-09 LAB — COMPREHENSIVE METABOLIC PANEL WITH GFR
ALT: 17 U/L (ref 0–35)
AST: 15 U/L (ref 0–37)
Albumin: 3.9 g/dL (ref 3.5–5.2)
Alkaline Phosphatase: 64 U/L (ref 39–117)
BUN: 9 mg/dL (ref 6–23)
CO2: 24 meq/L (ref 19–32)
Calcium: 8.8 mg/dL (ref 8.4–10.5)
Chloride: 103 meq/L (ref 96–112)
Creatinine, Ser: 0.68 mg/dL (ref 0.40–1.20)
GFR: 111.09 mL/min (ref >60.00)
Glucose, Bld: 101 mg/dL — ABNORMAL HIGH (ref 70–99)
Potassium: 3.7 meq/L (ref 3.5–5.1)
Sodium: 135 meq/L (ref 135–145)
Total Bilirubin: 0.4 mg/dL (ref 0.2–1.2)
Total Protein: 8 g/dL (ref 6.0–8.3)

## 2024-04-09 LAB — TSH: TSH: 2.19 u[IU]/mL (ref 0.35–5.50)

## 2024-04-09 MED ORDER — PHENTERMINE HCL 37.5 MG PO TABS: ORAL_TABLET | ORAL | 0 refills | Status: DC

## 2024-04-09 NOTE — Progress Notes (Signed)
 Labs stable Anemia better.  Take vitamin with iron or ideally, the prenatal vitamins A1C a little worse-prediabetes-needs to work more on diet/exercise to prevent diabetes  How long will she be in [place]-need to know how much malaria meds to send.  And needs to use condoms while on it.

## 2024-04-09 NOTE — Progress Notes (Signed)
 Phone [phone]   Subjective:   Patient is a 38 y.o. female presenting for annual physical.    Chief Complaint  Patient presents with   Annual Exam    Physical;    Annual.  [place]  Discussed the use of AI scribe software for clinical note transcription with the patient, who gave verbal consent to proceed.  History of Present Illness Christine Holland is a 38 year old female who presents for an annual physical exam.  She is preparing for a destination wedding in [place], [place], scheduled for December 21st. She is currently taking prenatal vitamins and is considering weight management options due to fluctuations in her weight. She has been walking for exercise but finds it challenging due to her weight changes. She inquired about the use of phentermine for weight loss, which was suggested by her sister. No SI  She has previously received the yellow fever vaccine and oral typhoid vaccine in August 2021, which is valid for five years. She is planning to travel to [place] and [place], and is concerned about malaria prophylaxis. She recalls being prescribed malaria medication for a previous trip to [place] in 2021.  She has a history of constipation, which she manages with increased water intake and occasional use of herbal teas like 'Smooth Move.' She acknowledges that her fiber intake could be improved. Her menstrual periods are regular, with the last one occurring on September 25th, making her next period due soon.  She is considering starting the HPV vaccine series, which is now recommended up to age 68. She plans to receive the flu vaccine as well.  No major headaches, migraines, dizziness, syncope, blurry vision, diplopia, sore throat, hoarseness, dysphagia, allergies, chest pain, palpitations, cough, wheezing, dyspnea, vomiting, diarrhea, or dysuria. She does report constipation. She does not smoke.     See problem oriented charting- ROS-  ROS: Gen: no fever, chills  Skin: no rash, itching ENT: no ear pain, ear drainage, nasal congestion, rhinorrhea, sinus pressure, sore throat Eyes: no blurry vision, double vision Resp: no cough, wheeze,SOB CV: no CP, palpitations, LE edema,  GI: no heartburn, n/v/d, abd pain GU: no dysuria, urgency, frequency, hematuria.  Lmp 9/25 MSK: no joint pain, myalgias, back pain Neuro: no dizziness, headache, weakness, vertigo Psych: no depression, anxiety, insomnia, SI   The following were reviewed and entered/updated in epic: Past Medical History:  Diagnosis Date   Alpha-thalassemia 2016   (pt is a carrier)  previouly followed by  dr freddie then followed by dr timmy ronco note w/ sarah cinciniti NP in epic 07-03-2014   Anemia    Bilateral fibroadenomas of breasts 2008   GERD (gastroesophageal reflux disease)    Pancytopenia (HCC) 2013   mild,  dx 2013   Pre-diabetes    Sickle cell trait    Uterine leiomyoma    Wears glasses    Patient Active Problem List   Diagnosis Date Noted   Well woman exam with routine gynecological exam 03/27/2024   Postoperative anemia 05/25/2023   Uterine leiomyoma 05/24/2023   Fibroids 04/13/2023   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   Sore throat 01/20/2016   FIBROADENOMA, BREAST 11/16/2009   ANA POSITIVE 10/05/2009   URINALYSIS, ABNORMAL 07/29/2009   SICKLE-CELL TRAIT 07/06/2009   OTHER NEUTROPENIA 07/06/2009   BREAST TENDERNESS 06/10/2009   BREAST MASS, RIGHT 06/10/2009   Past Surgical History:  Procedure Laterality Date   BREAST LUMPECTOMY Bilateral 2008   in  [place]   MYOMECTOMY N/A 05/24/2023   Procedure: ABDOMINAL MYOMECTOMY;  Surgeon: Dallie Vera GAILS, MD;  Location: [facility];  Service: Gynecology;  Laterality: N/A;    Family History  Problem Relation Age of Onset   Heart disease Mother 49   Hypertension Mother    Hypertension Father    Asthma Sister    Asthma Brother    Breast  cancer Neg Hx     Medications- reviewed and updated Current Outpatient Medications  Medication Sig Dispense Refill   ascorbic acid (VITAMIN C) 500 MG tablet Take 500 mg by mouth daily.     ibuprofen  (ADVIL ) 800 MG tablet Take 1 tablet (800 mg total) by mouth every 8 (eight) hours as needed for up to 42 doses. 42 tablet 0   phentermine (ADIPEX-P) 37.5 MG tablet Take half tablet daily  PO prior to breakfast.After a week, may increase to full tablet in needed 30 tablet 0   Prenatal Multivit-Min-Fe-FA (PRE-NATAL PO) Take by mouth.     No current facility-administered medications for this visit.    Allergies-reviewed and updated Allergies  Allergen Reactions   Other Anaphylaxis, Shortness Of Breath, Itching, Swelling and Other (See Comments)    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   Walnut Anaphylaxis, Shortness Of Breath, Itching, Swelling and Other (See Comments)    PER PATIENT: ALL TREE NUTS cause trouble breathing, throat swelling, itching   Apple Itching and Other (See Comments)    Mouth, throat, lips- ITCH   Pineapple Itching and Other (See Comments)    Mouth, throat, lips- [facility]    Social History   Social History Narrative   Marital status: single-engaged to marry July 2025;    From [place]; [place].      Children:  None      Lives: with parent/mom, brothers.      Employment:  Works for [employer] in [employer]; bilingual Water quality scientist.      Tobacco: none      Alcohol: none      Drugs: none      Exercise:  [employer]      Sexual activity:  None; last sexual activity 2005; total partners = 1; no STDs.     Objective  Objective:  BP 124/78   Pulse 88   Temp (!) 97.5 F (36.4 C)   Ht 5' 9 (1.753 m)   Wt 223 lb (101.2 kg)   LMP 03/09/2024 (Exact Date)   SpO2 99%   BMI 32.93 kg/m  Physical Exam  Gen: WDWN NAD HEENT: NCAT, conjunctiva not injected, sclera nonicteric TM WNL B, OP moist, no exudates  NECK:  supple, no thyromegaly, no nodes, no carotid  bruits CARDIAC: RRR, S1S2+, no murmur. DP 2+B LUNGS: CTAB. No wheezes ABDOMEN:  BS+, soft, NTND, No HSM, no masses EXT:  no edema MSK: no gross abnormalities. MS 5/5 all 4 NEURO: A&O x3.  CN II-XII intact.  PSYCH: normal mood. Good eye contact      Assessment and Plan   Health Maintenance counseling: 1. Anticipatory guidance: Patient counseled regarding regular dental exams q6 months, eye exams,  avoiding smoking and second hand smoke, limiting alcohol to 1 beverage per day, no illicit drugs.   2. Risk factor reduction:  Advised patient of need for regular exercise and diet rich and fruits and vegetables to reduce risk of heart attack and stroke. Exercise- +.  Wt Readings from Last 3 Encounters:  04/09/24 223 lb (101.2 kg)  03/27/24 222 lb 6.4 oz (100.9 kg)  08/31/23 227 lb 4 oz (103.1 kg)   3. Immunizations/screenings/ancillary studies Immunization History  Administered Date(s) Administered   Influenza, Seasonal, Injecte, Preservative Fre 04/07/2023   Influenza,inj,Quad PF,6+ Mos 05/01/2014   Moderna Sars-Covid-2 Vaccination 10/08/2019, 11/05/2019   Td 06/19/2007   Tdap 01/18/2018   Health Maintenance Due  Topic Date Due   Pneumococcal Vaccine (1 of 2 - PCV) Never done   Hepatitis B Vaccines 19-59 Average Risk (1 of 3 - 19+ 3-dose series) Never done   HPV VACCINES (1 - Risk 3-dose SCDM series) Never done   Influenza Vaccine  01/13/2024    4. Cervical cancer screening: utd 5. Skin cancer screening- advised regular sunscreen use. Denies worrisome, changing, or new skin lesions.  6. Birth control/STD check: non 7. Smoking associated screening: non smoker 8. Alcohol screening: neg  Wellness examination -     Lipid panel -     Comprehensive metabolic panel with GFR -     CBC with Differential/Platelet -     Hemoglobin A1c -     TSH  Other orders -     Phentermine HCl; Take half tablet daily  PO prior to breakfast.After a week, may increase to full tablet in needed   Dispense: 30 tablet; Refill: 0    Assessment and Plan Assessment & Plan Adult Wellness Visit   This routine adult wellness visit focused on travel preparations and vaccinations. Administered the first dose of the HPV vaccine and the influenza vaccine. Performed routine blood work. Malaria prophylaxis will be discussed at a later date. Encouraged continued physical activity and healthy lifestyle choices. Antic guidance  Overweight   She reports fluctuating weight. Discussed the potential use of phentermine for short-term weight loss, including risks and side effects such as increased blood pressure, jitteriness, and potential impact on future pregnancy plans. Emphasized the importance of lifestyle changes for long-term weight management. Prescribe phentermine, starting with half a tablet daily, then increasing to a full tablet. Schedule a follow-up in one month to monitor progress and side effects. Encourage increased physical activity, including short exercises during breaks. SED.  PDMP checked  Constipation   Chronic constipation was addressed with a discussion on dietary habits and fluid intake. Increase dietary fiber and fluid intake. Consider using Miralax  or Smooth Move tea as needed.     Recommended follow up: Return in about 4 weeks (around 05/07/2024) for weight 1 mo.  annual 1 yr.  Lab/Order associations:+ fasting   Jenkins CHRISTELLA Carrel, MD

## 2024-04-09 NOTE — Patient Instructions (Signed)

## 2024-05-02 ENCOUNTER — Ambulatory Visit: Admitting: Family Medicine

## 2024-05-02 VITALS — BP 114/78 | HR 82 | Temp 97.9°F | Ht 69.0 in | Wt 212.2 lb

## 2024-05-02 DIAGNOSIS — E66811 Obesity, class 1: Secondary | ICD-10-CM | POA: Diagnosis not present

## 2024-05-02 DIAGNOSIS — E6609 Other obesity due to excess calories: Secondary | ICD-10-CM

## 2024-05-02 DIAGNOSIS — Z2989 Encounter for other specified prophylactic measures: Secondary | ICD-10-CM

## 2024-05-02 DIAGNOSIS — Z6832 Body mass index (BMI) 32.0-32.9, adult: Secondary | ICD-10-CM

## 2024-05-02 DIAGNOSIS — Z3009 Encounter for other general counseling and advice on contraception: Secondary | ICD-10-CM

## 2024-05-02 MED ORDER — PHENTERMINE HCL 37.5 MG PO TABS: ORAL_TABLET | ORAL | 0 refills | Status: DC

## 2024-05-02 NOTE — Patient Instructions (Signed)
 Congrats!!!!!!!!!!1

## 2024-05-02 NOTE — Progress Notes (Signed)
 Subjective:     Patient ID: Christine Holland, female    DOB: April 05, 1986, 38 y.o.   MRN: [id number]  Chief Complaint  Patient presents with   Weight Loss    Pt is here for the weight loss medication that she started    Discussed the use of AI scribe software for clinical note transcription with the patient, who gave verbal consent to proceed.  History of Present Illness Christine Holland is a 38 year old female who presents for follow-up on weight management.  She has been taking phentermine  and working on her diet, resulting in a weight loss of eleven pounds. No adverse effects from the phentermine , such as shakiness, jitteriness, heart racing, or insomnia, are reported. She notes improved sleep quality and no headaches.  She is planning to get married on the 21st of December in [place], and is not using any form of contraception, indicating openness to pregnancy. She has a few phentermine  pills left and plans to discontinue them after the wedding.  She is considering malaria prophylaxis for an upcoming trip, with concerns about potential pregnancy during the trip. Various options for malaria prevention were discussed, including the use of condoms with certain medications and the duration of medication use post-travel.  She reports experiencing increased mucus in her throat and mouth for about a month, but denies runny nose, congestion, or heartburn. She wonders if this could be related to phentermine  use, although she believes the symptoms started before beginning the medication.     There are no preventive care reminders to display for this patient.  Past Medical History:  Diagnosis Date   Alpha-thalassemia 2016   (pt is a carrier)  previouly followed by  dr freddie then followed by dr timmy ronco note w/ sarah cinciniti NP in epic 07-03-2014   Anemia    Bilateral fibroadenomas of breasts 2008   GERD (gastroesophageal reflux disease)    Pancytopenia (HCC) 2013   mild,   dx 2013   Pre-diabetes    Sickle cell trait    Uterine leiomyoma    Wears glasses     Past Surgical History:  Procedure Laterality Date   BREAST LUMPECTOMY Bilateral 2008   in [place]   MYOMECTOMY N/A 05/24/2023   Procedure: ABDOMINAL MYOMECTOMY;  Surgeon: Dallie Vera GAILS, MD;  Location: [facility];  Service: Gynecology;  Laterality: N/A;     Current Outpatient Medications:    ascorbic acid (VITAMIN C) 500 MG tablet, Take 500 mg by mouth daily., Disp: , Rfl:    ibuprofen  (ADVIL ) 800 MG tablet, Take 1 tablet (800 mg total) by mouth every 8 (eight) hours as needed for up to 42 doses., Disp: 42 tablet, Rfl: 0   Prenatal Multivit-Min-Fe-FA (PRE-NATAL PO), Take by mouth., Disp: , Rfl:    phentermine  (ADIPEX-P ) 37.5 MG tablet, Take half tablet daily  PO prior to breakfast.After a week, may increase to full tablet in needed, Disp: 30 tablet, Rfl: 0  Allergies  Allergen Reactions   Other Anaphylaxis, Shortness Of Breath, Itching, Swelling and Other (See Comments)    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   Walnut Anaphylaxis, Shortness Of Breath, Itching, Swelling and Other (See Comments)    PER PATIENT: ALL TREE NUTS cause trouble breathing, throat swelling, itching   Apple Itching and Other (See Comments)    Mouth, throat, lips- ITCH   Pineapple Itching and Other (See Comments)    Mouth, throat, lips- ITCH   ROS neg/noncontributory except as noted  HPI/below      Objective:     BP 114/78 (BP Location: Left Arm, Patient Position: Sitting, Cuff Size: Large)   Pulse 82   Temp 97.9 F (36.6 C) (Temporal)   Ht 5' 9 (1.753 m)   Wt 212 lb 4 oz (96.3 kg)   LMP 04/11/2024 (Exact Date)   BMI 31.34 kg/m  Wt Readings from Last 3 Encounters:  05/02/24 212 lb 4 oz (96.3 kg)  04/09/24 223 lb (101.2 kg)  03/27/24 222 lb 6.4 oz (100.9 kg)    Physical Exam GENERAL: Well developed, well nourished, no acute distress. HEAD EYES EARS NOSE THROAT: Normocephalic, atraumatic,  conjunctiva not injected, sclera nonicteric. CARDIAC: Regular rate and rhythm, S1 S2 present, no murmur NECK: Supple, no thyromegaly, no nodes, no carotid bruits. LUNGS: Clear to auscultation bilaterally, no wheezes. EXTREMITIES: No edema. MUSCULOSKELETAL: No gross abnormalities. NEUROLOGICAL: Alert and oriented x3, cranial nerves II through XII intact. PSYCHIATRIC: Normal mood, good eye contact.        Assessment & Plan:  Class 1 obesity due to excess calories without serious comorbidity with body mass index (BMI) of 32.0 to 32.9 in adult  Need for malaria prophylaxis  Encounter for family planning counseling  Other orders -     Phentermine HCl; Take half tablet daily  PO prior to breakfast.After a week, may increase to full tablet in needed  Dispense: 30 tablet; Refill: 0    Assessment and Plan Assessment & Plan Obesity   Management with phentermine has resulted in an 11-pound weight loss over the past month. She reports no adverse effects such as jitteriness, heart palpitations, or headaches, and her sleep quality has improved. Phentermine will continue until after her wedding on December 21st, as it is contraindicated during pregnancy. The prescription for phentermine has been sent to the pharmacy.  Dry mouth likely medication-induced   Dry mouth is likely secondary to phentermine use, exacerbated by winter weather. There is no associated heartburn or congestion, but symptoms have persisted for over two weeks. She should increase water intake to alleviate dry mouth and consider using Mucinex to loosen mucus if needed.  Preconceptual counseling-pt will be getting married 06/03/24 and no contraception.  Taking prenatal vitamins.  Will need to stop phentermine prior  Need for Malaria prophy for [place]-looked up area-recommended.  Options discussed w/pt.  She will d/w fiancee and let me know.    Discussed no doxy d/t length and contraindicated in pregnancy Discussed Malarone-she  would need to use condoms but good option as will only need to take for 1 wk post trip(Prophylaxis should begin 1-2 days before entering a malaria-endemic area, continue daily during exposure, and for 7 days after leaving the area. Tablets should be taken with food or a milky drink to enhance absorption-so will need  tabs(leaving 12/10 so needs to start 12/9-can't recall when returning)  1 tab daily  If not want to use condoms, only other option is mefloquine -safe in all trimesters of pregnancy-For malaria prophylaxis in adults, the dose is 250 mg (one tablet) once weekly, starting at least 1-2 weeks before travel to an endemic area, continued weekly during exposure, and for 4 weeks after leaving the area. The tablet should be taken on the same day each week, preferably after a meal, and with at least 8 oz (240 mL) of wate     Return if symptoms worsen or fail to improve.  Jenkins CHRISTELLA Carrel, MD

## 2024-05-17 ENCOUNTER — Encounter: Payer: Self-pay | Admitting: Family Medicine

## 2024-05-17 ENCOUNTER — Other Ambulatory Visit: Payer: Self-pay | Admitting: Family

## 2024-05-17 MED ORDER — MEFLOQUINE HCL 250 MG PO TABS: 250.0000 mg | ORAL_TABLET | ORAL | 0 refills | Status: DC

## 2024-05-21 ENCOUNTER — Ambulatory Visit: Admitting: Family Medicine

## 2024-07-17 ENCOUNTER — Ambulatory Visit (INDEPENDENT_AMBULATORY_CARE_PROVIDER_SITE_OTHER): Admitting: Obstetrics and Gynecology

## 2024-07-17 ENCOUNTER — Encounter: Payer: Self-pay | Admitting: Obstetrics and Gynecology

## 2024-07-17 VITALS — BP 106/62 | HR 91 | Temp 98.1°F | Wt 218.0 lb

## 2024-07-17 DIAGNOSIS — N912 Amenorrhea, unspecified: Secondary | ICD-10-CM

## 2024-07-17 DIAGNOSIS — Z3201 Encounter for pregnancy test, result positive: Secondary | ICD-10-CM | POA: Diagnosis not present

## 2024-07-17 LAB — PREGNANCY, URINE: Preg Test, Ur: POSITIVE — AB

## 2024-07-17 NOTE — Patient Instructions (Signed)
 Avoid ibuprofen  during pregnancy. Use Tylenol  as needed for aches and pains.

## 2025-03-28 ENCOUNTER — Ambulatory Visit: Admitting: Obstetrics and Gynecology

## 2025-04-10 ENCOUNTER — Encounter: Admitting: Family Medicine

## 2025-04-11 ENCOUNTER — Encounter: Admitting: Family Medicine

## 2025-04-25 ENCOUNTER — Encounter: Admitting: Family Medicine
